# Patient Record
Sex: Male | Born: 1985 | Race: Black or African American | Hispanic: No | Marital: Single | Smoking: Current every day smoker
Health system: Southern US, Community
[De-identification: ages and names within clinical notes are randomized; demographics above are authoritative.]

## PROBLEM LIST (undated history)

## (undated) DIAGNOSIS — L709 Acne, unspecified: Secondary | ICD-10-CM

## (undated) DIAGNOSIS — F419 Anxiety disorder, unspecified: Secondary | ICD-10-CM

## (undated) DIAGNOSIS — F259 Schizoaffective disorder, unspecified: Secondary | ICD-10-CM

## (undated) DIAGNOSIS — F70 Mild intellectual disabilities: Secondary | ICD-10-CM

## (undated) DIAGNOSIS — L97509 Non-pressure chronic ulcer of other part of unspecified foot with unspecified severity: Secondary | ICD-10-CM

## (undated) DIAGNOSIS — F71 Moderate intellectual disabilities: Secondary | ICD-10-CM

## (undated) DIAGNOSIS — F639 Impulse disorder, unspecified: Secondary | ICD-10-CM

## (undated) HISTORY — DX: Schizoaffective disorder, unspecified: F25.9

## (undated) HISTORY — DX: Mild intellectual disabilities: F70

## (undated) HISTORY — PX: NO PAST SURGERIES: SHX2092

---
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
## 2006-09-11 ENCOUNTER — Encounter: Payer: Self-pay | Admitting: Family Medicine

## 2009-08-29 ENCOUNTER — Ambulatory Visit: Payer: Self-pay | Admitting: Family Medicine

## 2009-08-29 DIAGNOSIS — L708 Other acne: Secondary | ICD-10-CM | POA: Insufficient documentation

## 2009-08-29 DIAGNOSIS — F71 Moderate intellectual disabilities: Secondary | ICD-10-CM | POA: Insufficient documentation

## 2009-08-29 DIAGNOSIS — F639 Impulse disorder, unspecified: Secondary | ICD-10-CM

## 2009-08-29 DIAGNOSIS — F259 Schizoaffective disorder, unspecified: Secondary | ICD-10-CM | POA: Insufficient documentation

## 2009-08-29 DIAGNOSIS — F17200 Nicotine dependence, unspecified, uncomplicated: Secondary | ICD-10-CM | POA: Insufficient documentation

## 2009-08-29 HISTORY — DX: Impulse disorder, unspecified: F63.9

## 2009-08-29 HISTORY — DX: Moderate intellectual disabilities: F71

## 2009-09-06 ENCOUNTER — Ambulatory Visit: Payer: Self-pay | Admitting: Family Medicine

## 2009-09-06 ENCOUNTER — Encounter: Payer: Self-pay | Admitting: Family Medicine

## 2009-09-06 LAB — CONVERTED CEMR LAB
ALT: 38 units/L (ref 0–53)
AST: 30 units/L (ref 0–37)
Albumin: 4.1 g/dL (ref 3.5–5.2)
Alkaline Phosphatase: 106 units/L (ref 39–117)
BUN: 8 mg/dL (ref 6–23)
Calcium: 9.1 mg/dL (ref 8.4–10.5)
Chloride: 104 meq/L (ref 96–112)
Creatinine, Ser: 0.79 mg/dL (ref 0.40–1.50)
HDL: 35 mg/dL — ABNORMAL LOW (ref >39)
LDL Cholesterol: 147 mg/dL — ABNORMAL HIGH (ref 0–99)
Potassium: 4.1 meq/L (ref 3.5–5.3)
Total CHOL/HDL Ratio: 6

## 2010-02-18 ENCOUNTER — Ambulatory Visit: Payer: Self-pay | Admitting: Family Medicine

## 2010-08-06 NOTE — Consult Note (Signed)
Summary: [facility]  [facility]   Imported By: De Nurse 09/12/2009 14:32:32  _____________________________________________________________________  External Attachment:    Type:   Image     Comment:   External Document

## 2010-08-06 NOTE — Assessment & Plan Note (Signed)
Summary: cpe,df   Vital Signs:  Patient profile:   25 year old male Height:      63 inches Weight:      153 pounds BMI:     27.20 Temp:     98.1 degrees F oral Pulse rate:   79 / minute BP sitting:   127 / 84  (left arm) Cuff size:   regular  Vitals Entered By: Tessie Fass CMA (February 18, 2010 4:15 PM) CC: CPE Pain Assessment Patient in pain? no        Primary Care Provider:  Clementeen Graham  CC:  CPE.  History of Present Illness: Doing well in group home. Down to 7 ciggs a day. Feels well. Minimal outbursts and behavior is well controlled.  Medication reviewed with MAR by staff.  FL2 form filled out.   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 1 By Mouth Bid 3)  Benzoyl Peroxide 3 % Pads (Benzoyl Peroxide) 4)  Ziana 1.2-0.025 % Gel (Clindamycin-Tretinoin) 5)  Minocycline Hcl 100 Mg Tabs (Minocycline Hcl) .Marland Kitchen.. 1 By Mouth Bid  Allergies (verified): No Known Drug Allergies  Past History:  Past Medical History: 1) Acne, on Minocycline, Benzoyl Peroxide, and Ziana Gel,  Sees [facility] in [place]. 2) Psych Problems:  See problem list.  Currently managed by Haldol Depo and oral daily.  Well controlled.  Hears voices sometimes.  Has hx of admit to [facility] in past. Currently is ward of the state. 3) Tobacco. On 7 ciggs a day and stable. Was on 3 PPD  Family History: Reviewed history from 08/29/2009 and no changes required. Unknown,  Wilis is unable to provide  Social History: Reviewed history from 08/29/2009 and no changes required. Is a ward of the state.   Is living at [facility] in [place] since 03/2010. Contact person is April Evans [phone] Guardian is Kristin Bruins, she is through [place] [facility] [place].  [phone] Hx of Alcohol, MJ, and Tobacco abuse.  Currently smokes 7 cigs daily, but hx of 3 ppd.  Current Smoker Regular exercise-yes Packs/Day:  0.25  Review of Systems  The patient denies anorexia, weight loss, chest pain, prolonged cough, abdominal pain, hematuria, muscle weakness, difficulty walking, unusual weight change, and enlarged lymph nodes.    Physical Exam  General:  VS noted.  Well male in NAD Eyes:  No corneal or conjunctival inflammation noted. EOMI. Perrla. Vision grossly normal. Ears:  External ear exam shows no significant lesions or deformities.  Otoscopic examination reveals clear canals, tympanic membranes are intact bilaterally without bulging, retraction, inflammation or discharge. Hearing is grossly normal bilaterally. Nose:  External nasal examination shows no deformity or inflammation. Nasal mucosa are pink and moist without lesions or exudates. Mouth:  Oral mucosa and oropharynx without lesions or exudates.  Teeth in good repair. Lungs:  Normal respiratory effort, chest expands symmetrically. Lungs are clear to auscultation, no crackles or wheezes. Heart:  Normal rate and regular rhythm. S1 and  S2 normal without gallop, murmur, click, rub or other extra sounds. Abdomen:  Bowel sounds positive,abdomen soft and non-tender without masses, organomegaly or hernias noted. Msk:  No deformity or scoliosis noted of thoracic or lumbar spine.   Extremities:  No clubbing, cyanosis, edema, or deformity noted with normal full range of motion of all joints.   Neurologic:  No cranial nerve deficits noted. Station and gait are normal. Plantar reflexes are down-going bilaterally. DTRs  are symmetrical throughout. Sensory, motor and coordinative functions appear intact. Skin:  turgor normal, color normal, no rashes, no suspicious lesions, no ecchymoses, no petechiae, no purpura, no ulcerations, no edema.  One leaf tattoo noted on left forarm and one old scar on right forarm.   Cervical Nodes:  No lymphadenopathy noted Axillary Nodes:  No palpable lymphadenopathy Psych:  not anxious appearing, not depressed appearing, not agitated, not suicidal, not homicidal, and flat affect.     Impression & Recommendations:  Problem # 1:  PHYSICAL EXAMINATION (ICD-V70.0) Assessment Unchanged  Doing well in group home. Obviously well looked after. No current issues aside from tobacco use. Jowan is wedded to the idea of smoking 7 ciggs daily and is down from 3ppd.  Plan to encourage to use 6 ciggs a day and follow.  Plan to follow up in 1 year. Flu shot in fall.   Orders: [facility] - Est  18-39 yrs ([id number])  Complete Medication List: 1)  Haloperidol Depo 100 Mg Inj Q 4 Weeks  2)  Haloperidol 10 Mg Tabs (Haloperidol) .Marland Kitchen.. 1 by mouth bid 3)  Benzoyl Peroxide 3 % Pads (Benzoyl peroxide) 4)  Ziana 1.2-0.025 % Gel (Clindamycin-tretinoin) 5)  Minocycline Hcl 100 Mg Tabs (Minocycline hcl) .Marland Kitchen.. 1 by mouth bid  Patient Instructions: 1)  Please return for flu shot in the fall. 2)  Come back for a physical in 1 year or sooner if needed.
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
## 2011-02-28 ENCOUNTER — Encounter: Payer: Self-pay | Admitting: Family Medicine

## 2011-03-13 ENCOUNTER — Encounter: Payer: Self-pay | Admitting: Family Medicine

## 2011-03-13 ENCOUNTER — Ambulatory Visit (INDEPENDENT_AMBULATORY_CARE_PROVIDER_SITE_OTHER): Payer: Medicaid Other | Admitting: Family Medicine

## 2011-03-13 DIAGNOSIS — F71 Moderate intellectual disabilities: Secondary | ICD-10-CM

## 2011-03-13 DIAGNOSIS — F259 Schizoaffective disorder, unspecified: Secondary | ICD-10-CM

## 2011-03-13 DIAGNOSIS — F17200 Nicotine dependence, unspecified, uncomplicated: Secondary | ICD-10-CM

## 2011-03-13 NOTE — Assessment & Plan Note (Signed)
No desire to quit at this time.

## 2011-03-13 NOTE — Assessment & Plan Note (Signed)
Managed by psychiatry with haloperidol doing well no changes

## 2011-03-13 NOTE — Assessment & Plan Note (Signed)
Living in a group home is managing his own ADLs well

## 2011-03-13 NOTE — Progress Notes (Signed)
Thomas [name] is here for his annual physical exam.  He continues to live at the group home and he continues to be a ward of the state.  His medications were updated he currently takes depo haloperidol, and oral haloperidol.  Per the patient and his staff with him he is doing well and in not having very many destructive outbursts.  He continues to smoke 7 cigarettes daily and has no desire to quit.  Otherwise he's doing well.  PMH reviewed.  ROS as above otherwise neg  Exam:  BP 122/83  Pulse 86  Wt 153 lb (69.4 kg) Gen: Well NAD HEENT: EOMI,  MMM Lungs: CTABL Nl WOB Heart: RRR no MRG Abd: NABS, NT, ND Exts: Non edematous BL  LE, warm and well perfused.  Skin: On face scars and evidence of pustular acne, several tattoos on limbs, otherwise no rashes.

## 2011-10-07 ENCOUNTER — Encounter: Payer: Self-pay | Admitting: Family Medicine

## 2011-10-07 ENCOUNTER — Ambulatory Visit (INDEPENDENT_AMBULATORY_CARE_PROVIDER_SITE_OTHER): Payer: Medicaid Other | Admitting: Family Medicine

## 2011-10-07 DIAGNOSIS — K5900 Constipation, unspecified: Secondary | ICD-10-CM | POA: Insufficient documentation

## 2011-10-07 DIAGNOSIS — K921 Melena: Secondary | ICD-10-CM | POA: Insufficient documentation

## 2011-10-07 MED ORDER — POLYETHYLENE GLYCOL 3350 17 GM/SCOOP PO POWD: 17.0000 g | Freq: Two times a day (BID) | ORAL | Status: AC | PRN

## 2011-10-07 NOTE — Assessment & Plan Note (Signed)
Likely nidus for internal hemorrhoids w/ strenuous BMs and secondary bloody stools x 2. Will obtain KUB to assess stool burden. Scheduled miralax. Handout given. F/u prn.

## 2011-10-07 NOTE — Patient Instructions (Signed)

## 2011-10-07 NOTE — Progress Notes (Signed)
  Subjective:    Patient ID: Thomas [name], male    DOB: Oct 27, 1985, 26 y.o.   MRN: [id number]  HPI Pt is here to discuss bloody stools and constipation Pt is currently ;iving in an adult grouop home 2/2 moderate MR and schizoaffective disorder. Caregiver is present with pt today.   Blood in stools x 2 this week. Pt states that he noticed a small amount of blood in toilet after stooling. Last episode was yesterday. First episode was late last week. Pt with a baseline hx/o chronic constipation. Pt states that he has had progressively strenuous BMs over the last 2-3 weeks. Pt denies any ASA, NSAID use. No noted rectal trauma per pt. Pt denies any abdominal pain, nausea, dysuria, hematuria.    Review of Systems See HPI, otherwise ROS negative     Objective:   Physical Exam  Genitourinary:          Noted internal hemmorrhoids on anoscopy  Hemoccult negative    Gen: up in chair, NAD CV: RRR, no murmurs auscultated PULM: CTAB, no wheezes, rales, rhoncii ABD: S/NT/+ bowel sounds  EXT: 2+ peripheral pulses         Assessment & Plan:

## 2011-10-07 NOTE — Assessment & Plan Note (Signed)
Likely secondary to strenuous BMs + internal hemorrhoids. Overall exam reassuring. Hemoccult negative. Scheduled miralax.

## 2011-10-08 ENCOUNTER — Encounter: Payer: Self-pay | Admitting: Family Medicine

## 2011-10-08 ENCOUNTER — Ambulatory Visit
Admission: RE | Admit: 2011-10-08 | Discharge: 2011-10-08 | Disposition: A | Discharge status: Home / Self Care | Payer: Medicaid Other | Source: Ambulatory Visit | Attending: Family Medicine | Admitting: Family Medicine

## 2011-12-19 ENCOUNTER — Encounter: Payer: Self-pay | Admitting: Family Medicine

## 2011-12-19 ENCOUNTER — Ambulatory Visit (INDEPENDENT_AMBULATORY_CARE_PROVIDER_SITE_OTHER): Payer: Medicaid Other | Admitting: Family Medicine

## 2011-12-19 VITALS — BP 121/84 | HR 71 | Ht 63.0 in | Wt 157.0 lb

## 2011-12-19 DIAGNOSIS — L708 Other acne: Secondary | ICD-10-CM

## 2011-12-19 MED ORDER — BENZOYL PEROXIDE-CLEANSER 4 % EX KIT: 1.0000 "application " | PACK | Freq: Two times a day (BID) | CUTANEOUS | Status: DC

## 2011-12-19 MED ORDER — TRETINOIN 0.025 % EX CREA: TOPICAL_CREAM | Freq: Every day | CUTANEOUS | Status: AC

## 2011-12-19 MED ORDER — CLINDAMYCIN PHOS-BENZOYL PEROX 1-5 % EX GEL: Freq: Every day | CUTANEOUS | Status: AC

## 2011-12-19 MED ORDER — DOXYCYCLINE HYCLATE 100 MG PO TABS: 100.0000 mg | ORAL_TABLET | Freq: Two times a day (BID) | ORAL | Status: AC

## 2011-12-19 NOTE — Assessment & Plan Note (Signed)
Pustular acne. Plan to resume topical therapies as noted in the medication list. Additionally we'll add doxycycline twice a day. Additionally I will refer to dermatology for further evaluation and management. He may not be fully controlled with the above medications. He may benefit from Accutane however it concern about worsening behavioral control at this medication. We'll follow this issue in a month or 2 or with dermatology.  Additionally warned about sunscreen with doxycycline.

## 2011-12-19 NOTE — Progress Notes (Signed)
Thomas [name] is a 26 y.o. male who presents to [facility] today for pustular acne. Patient has a history of acne and previously was taking tretinoin, BenzaClin, and a face wash.  This is somewhat controlled his acne. However he became frustrated and stopped taking the topical medications and his acne has worsened.  He wonders if there is a pill he can take for acne control.   He is otherwise well. His moderate mental retardation and schizoaffective disorder are well-controlled at the group home with haloperidol.  He continues to smoke daily.   PMH: Reviewed schizoaffective disorder and mild mental retardation History  Substance Use Topics  . Smoking status: Current Everyday Smoker -- 0.5 packs/day  . Smokeless tobacco: Never Used  . Alcohol Use: No   ROS as above  Medications reviewed. Current Outpatient Prescriptions  Medication Sig Dispense Refill  . haloperidol (HALDOL) 5 MG tablet Take 5 mg by mouth 2 (two) times daily.        . haloperidol decanoate (HALDOL DECANOATE) 100 MG/ML injection Inject 100 mg into the muscle every 28 (twenty-eight) days.        Marland Kitchen Benzoyl Peroxide-Cleanser (LAVOCLEN-4 ACNE WASH) 4 % KIT Apply 1 application topically 2 (two) times daily. 1 qs  1 each  5  . clindamycin-benzoyl peroxide (BENZACLIN) gel Apply topically daily.  25 g  5  . doxycycline (VIBRA-TABS) 100 MG tablet Take 1 tablet (100 mg total) by mouth 2 (two) times daily.  60 tablet  2  . tretinoin (RETIN-A) 0.025 % cream Apply topically at bedtime.  45 g  5    Exam:  BP 121/84  Pulse 71  Ht 5\' 3"  (1.6 m)  Wt 157 lb (71.215 kg)  BMI 27.81 kg/m2 Gen: Well NAD SKIN: Pustular acne on the face with scarring on both cheeks   No results found for this or any previous visit (from the past 72 hour(s)).

## 2011-12-19 NOTE — Patient Instructions (Addendum)
Thank you for coming in today. For acne use the crams and gells as directed.  Take the doxycycline twice a day.  We will call within 1 week about the appointment to the dermatologist.  Use sun screen with doxycycline.  If you have stomach upset let us know.  Otherwise we should check in at least 1x a year.  Consider stopping smoking.

## 2012-03-19 ENCOUNTER — Ambulatory Visit (INDEPENDENT_AMBULATORY_CARE_PROVIDER_SITE_OTHER): Payer: Medicaid Other | Admitting: Family Medicine

## 2012-03-19 ENCOUNTER — Encounter: Payer: Self-pay | Admitting: Family Medicine

## 2012-03-19 VITALS — BP 126/89 | HR 84 | Ht 64.0 in | Wt 156.0 lb

## 2012-03-19 DIAGNOSIS — Z5181 Encounter for therapeutic drug level monitoring: Secondary | ICD-10-CM

## 2012-03-19 LAB — CBC
HCT: 43.8 % (ref 39.0–52.0)
Platelets: 234 10*3/uL (ref 150–400)
RBC: 5.03 MIL/uL (ref 4.22–5.81)
RDW: 13.9 % (ref 11.5–15.5)
WBC: 13 10*3/uL — ABNORMAL HIGH (ref 4.0–10.5)

## 2012-03-19 LAB — COMPREHENSIVE METABOLIC PANEL
ALT: 25 U/L (ref 0–53)
CO2: 28 mEq/L (ref 19–32)
Calcium: 9.5 mg/dL (ref 8.4–10.5)
Chloride: 106 mEq/L (ref 96–112)
Creat: 0.89 mg/dL (ref 0.50–1.35)
Glucose, Bld: 67 mg/dL — ABNORMAL LOW (ref 70–99)
Total Bilirubin: 0.8 mg/dL (ref 0.3–1.2)
Total Protein: 7 g/dL (ref 6.0–8.3)

## 2012-03-19 LAB — LIPID PANEL
HDL: 32 mg/dL — ABNORMAL LOW (ref >39)
Total CHOL/HDL Ratio: 6.4 Ratio
Triglycerides: 134 mg/dL (ref <150)

## 2012-03-21 NOTE — Progress Notes (Signed)
Patient ID: Thomas [name], male   DOB: 06-26-1986, 26 y.o.   MRN: [id number] SUBJECTIVE:  Thomas [name] is a 26 y.o. male presenting for his annual checkup.  He is currently in a group home.  He is followed by psych for his schizoaffective disorder.  His medication regimen has been changed to haldol tid and his haldol decanoate has been discontinued.  He has also been started on doxycycline daily for pustular acne.   Current Outpatient Prescriptions  Medication Sig Dispense Refill  . doxycycline (DORYX) 100 MG DR capsule Take 100 mg by mouth daily.      Marland Kitchen Benzoyl Peroxide-Cleanser (LAVOCLEN-4 ACNE WASH) 4 % KIT Apply 1 application topically 2 (two) times daily. 1 qs  1 each  5  . clindamycin-benzoyl peroxide (BENZACLIN) gel Apply topically daily.  25 g  5  . haloperidol (HALDOL) 5 MG tablet Take 5 mg by mouth 3 (three) times daily.       Marland Kitchen tretinoin (RETIN-A) 0.025 % cream Apply topically at bedtime.  45 g  5   Allergies: Review of patient's allergies indicates no known allergies.   ROS:  Feeling well. No dyspnea or chest pain on exertion. No abdominal pain, change in bowel habits, black or bloody stools. No urinary tract or prostatic symptoms. No neurological complaints.  OBJECTIVE:  The patient appears well, alert, oriented x 3, in no distress.  BP 126/89  Pulse 84  Ht 5\' 4"  (1.626 m)  Wt 156 lb (70.761 kg)  BMI 26.78 kg/m2 ENT normal.  Neck supple. No adenopathy or thyromegaly. PERLA. Lungs are clear, good air entry, no wheezes, rhonchi or rales. S1 and S2 normal, no murmurs, regular rate and rhythm. Abdomen is soft without tenderness, guarding, mass or organomegaly.  Extremities with multiple tattoos. No edema, normal peripheral pulses. Neurological is normal without focal findings.  ASSESSMENT:  healthy adult male  PLAN:  quit smoking and return for routine annual checkups Check labwork given daily antipsychotic use.

## 2012-06-17 ENCOUNTER — Other Ambulatory Visit: Payer: Self-pay | Admitting: Family Medicine

## 2012-06-17 NOTE — Telephone Encounter (Signed)
Pt is out of his doxycycline and needs refill for his acne  [facility] - [phone] - [place]

## 2012-06-17 NOTE — Telephone Encounter (Signed)
Fwd. To PCP for refill. .Thomas [name]  

## 2012-06-21 MED ORDER — DOXYCYCLINE HYCLATE 100 MG PO CPEP: 100.0000 mg | ORAL_CAPSULE | Freq: Every day | ORAL | Status: DC

## 2013-03-31 ENCOUNTER — Ambulatory Visit (INDEPENDENT_AMBULATORY_CARE_PROVIDER_SITE_OTHER): Payer: Medicaid Other | Admitting: Family Medicine

## 2013-03-31 VITALS — BP 107/78 | HR 85 | Ht 64.0 in | Wt 147.0 lb

## 2013-03-31 DIAGNOSIS — F259 Schizoaffective disorder, unspecified: Secondary | ICD-10-CM

## 2013-03-31 DIAGNOSIS — L708 Other acne: Secondary | ICD-10-CM

## 2013-03-31 DIAGNOSIS — Z23 Encounter for immunization: Secondary | ICD-10-CM

## 2013-03-31 DIAGNOSIS — F17200 Nicotine dependence, unspecified, uncomplicated: Secondary | ICD-10-CM

## 2013-03-31 LAB — COMPREHENSIVE METABOLIC PANEL
Alkaline Phosphatase: 97 U/L (ref 39–117)
CO2: 26 mEq/L (ref 19–32)
Creat: 0.79 mg/dL (ref 0.50–1.35)
Glucose, Bld: 83 mg/dL (ref 70–99)
Total Bilirubin: 0.6 mg/dL (ref 0.3–1.2)

## 2013-03-31 LAB — CBC WITH DIFFERENTIAL/PLATELET
Eosinophils Relative: 1 % (ref 0–5)
HCT: 44.2 % (ref 39.0–52.0)
Hemoglobin: 15.3 g/dL (ref 13.0–17.0)
Lymphocytes Relative: 28 % (ref 12–46)
Lymphs Abs: 2.4 10*3/uL (ref 0.7–4.0)
MCV: 85.7 fL (ref 78.0–100.0)
Monocytes Absolute: 0.5 10*3/uL (ref 0.1–1.0)
Monocytes Relative: 5 % (ref 3–12)
RBC: 5.16 MIL/uL (ref 4.22–5.81)
WBC: 8.5 10*3/uL (ref 4.0–10.5)

## 2013-03-31 NOTE — Assessment & Plan Note (Signed)
Managed by Psychiatrist on Haldol, no changes Checked CMP and CBC

## 2013-03-31 NOTE — Progress Notes (Signed)
  Subjective:    Patient ID: Thomas [name], male    DOB: February 26, 1986, 27 y.o.   MRN: [id number]  HPI  Pt here for annual exam , He has schizzoeaffective disorder and lives ina group home.   He denies any problems today, denies fever, chills, sweats, sore throat, dyspnea, chest pain, abd pain, nausea, constipation, diarrhea, and swelling.   I filled out his FL2 for this year  He see's a psychiatrist , Ellis Savage He see's a dermatologist for acne, at [facility].   Review of Systems Per HPI    Objective:   Physical Exam  Gen: NAD, alert, cooperative with exam HEENT: NCAT, EOMI, PERRL CV: RRR, good S1/S2, no murmur Resp: CTABL, no wheezes, non-labored Abd: SNTND, no guarding or organomegaly Ext: No edema, warm Neuro: Alert and oriented, No gross deficits, Strength 5/5 and sensation intact in all 4 extremities, CN2-12 intact except for slight tongue deviation to the right with protrusion.      Assessment & Plan:

## 2013-03-31 NOTE — Assessment & Plan Note (Signed)
Currently smoking, likely part of his disease state with schizoaffective d/o

## 2013-03-31 NOTE — Patient Instructions (Signed)
It was great to meet you   Please let us know if he gets sick at all, and we'd be glad to see him. Otherwise we'll see you in a year.   I will let you know if anything is off with his labs, otherwise I will send a letter if they are normal.

## 2013-03-31 NOTE — Assessment & Plan Note (Signed)
Pustular acne, now on topical retinoid and PO doxy per derm.

## 2013-04-01 ENCOUNTER — Encounter: Payer: Self-pay | Admitting: Family Medicine

## 2014-02-27 ENCOUNTER — Telehealth: Payer: Self-pay | Admitting: Family Medicine

## 2014-02-27 DIAGNOSIS — L708 Other acne: Secondary | ICD-10-CM

## 2014-02-27 NOTE — Telephone Encounter (Signed)
Pt request a new referral to [facility]. He is an established patient there but referral has expired and her needs a new one before they will schedule him. Pls advise.

## 2014-02-27 NOTE — Telephone Encounter (Signed)
Forward to PCP to place new referral.Thomas [name], Rodena Medin

## 2014-02-27 NOTE — Telephone Encounter (Signed)
New referral for [facility] derm written.   Murtis Sink, MD [facility] Resident, PGY-3 02/27/2014, 4:43 PM

## 2014-03-27 ENCOUNTER — Encounter: Payer: Self-pay | Admitting: Family Medicine

## 2014-03-27 ENCOUNTER — Ambulatory Visit (INDEPENDENT_AMBULATORY_CARE_PROVIDER_SITE_OTHER): Payer: Medicaid Other | Admitting: Family Medicine

## 2014-03-27 VITALS — BP 130/90 | HR 100 | Temp 98.2°F | Ht 64.0 in | Wt 142.9 lb

## 2014-03-27 DIAGNOSIS — L708 Other acne: Secondary | ICD-10-CM | POA: Diagnosis not present

## 2014-03-27 DIAGNOSIS — F259 Schizoaffective disorder, unspecified: Secondary | ICD-10-CM

## 2014-03-27 DIAGNOSIS — B353 Tinea pedis: Secondary | ICD-10-CM

## 2014-03-27 DIAGNOSIS — F71 Moderate intellectual disabilities: Secondary | ICD-10-CM | POA: Diagnosis not present

## 2014-03-27 DIAGNOSIS — Z23 Encounter for immunization: Secondary | ICD-10-CM

## 2014-03-27 DIAGNOSIS — M21619 Bunion of unspecified foot: Secondary | ICD-10-CM

## 2014-03-27 DIAGNOSIS — F17200 Nicotine dependence, unspecified, uncomplicated: Secondary | ICD-10-CM | POA: Diagnosis not present

## 2014-03-27 DIAGNOSIS — M21612 Bunion of left foot: Secondary | ICD-10-CM

## 2014-03-27 MED ORDER — NYSTATIN 100000 UNIT/GM EX POWD: Freq: Two times a day (BID) | CUTANEOUS | Status: DC

## 2014-03-27 NOTE — Assessment & Plan Note (Signed)
Not ready to quit Schizoaffective d/o

## 2014-03-27 NOTE — Assessment & Plan Note (Signed)
On topical tretinoin and minocycline per derm.

## 2014-03-27 NOTE — Assessment & Plan Note (Signed)
Filled out FL2, lives in a group home considering mental illness as well.

## 2014-03-27 NOTE — Assessment & Plan Note (Signed)
L 5th toe bunion Requests podiatry Referral written

## 2014-03-27 NOTE — Progress Notes (Signed)
Patient ID: Thomas [name], male   DOB: 12/02/1985, 28 y.o.   MRN: [id number]   HPI  Patient presents today for annual check up, itchy feet  Itchy feet- has been going on for a while, some pain after vigorous scratching, both feet Hasnt tried anything for this  Bunion On his l 5th toe, they request podiatry Not terribly painful Has not tried any salicylic acid/pads  Smoking- not interested in quitting.   Smoking status noted ROS: Per HPI  Objective: BP 130/90  Pulse 100  Temp(Src) 98.2 F (36.8 C) (Oral)  Ht  (1.626 m)  Wt 142 lb 14.4 oz (64.819 kg)  BMI 24.52 kg/m2 Gen: NAD, alert, cooperative with exam HEENT: NCAT, EOMI, OP WNL CV: RRR, good S1/S2, no murmur Resp: CTABL, no wheezes, non-labored Abd: SNTND, BS present Ext: No edema, warm Neuro: Alert and oriented, No gross deficits Feet: maceration between 4th and 5th digit BL, Few red papules on lateral distal feet, no excoriation and no open lesions  Assessment and plan:  Tinea pedis BL between 4th and 5th digit Macerated look so will use powder for drying, nystatin powder Followup PRN  SCHIZOAFFECTIVE DISORDER UNSPECIFIED On Hladol PO daily, 5 mg Managed by psychiatry They will fax recent labs   NICOTINE ADDICTION Not ready to quit Schizoaffective d/o  MENTAL RETARDATION, MODERATE Filled out FL2, lives in a group home considering mental illness as well.   ACNE VULGARIS, CYSTIC, PUSTULAR On topical tretinoin and minocycline per derm.   Bunion L 5th toe bunion Requests podiatry Referral written      Meds ordered this encounter  Medications  . nystatin (MYCOSTATIN) powder    Sig: Apply topically 2 (two) times daily. To feet    Dispense:  15 g    Refill:  0

## 2014-03-27 NOTE — Patient Instructions (Signed)
Great to see you today  Let me know if his feet dont get better  Come back next year or sooner if needed.

## 2014-03-27 NOTE — Assessment & Plan Note (Signed)
On Hladol PO daily, 5 mg Managed by psychiatry They will fax recent labs

## 2014-03-27 NOTE — Assessment & Plan Note (Signed)
BL between 4th and 5th digit Macerated look so will use powder for drying, nystatin powder Followup PRN

## 2014-04-05 ENCOUNTER — Ambulatory Visit (INDEPENDENT_AMBULATORY_CARE_PROVIDER_SITE_OTHER): Payer: Medicaid Other | Admitting: Podiatry

## 2014-04-05 ENCOUNTER — Encounter: Payer: Self-pay | Admitting: Podiatry

## 2014-04-05 VITALS — BP 112/78 | HR 77 | Resp 14 | Ht 64.0 in | Wt 147.0 lb

## 2014-04-05 DIAGNOSIS — L97509 Non-pressure chronic ulcer of other part of unspecified foot with unspecified severity: Secondary | ICD-10-CM

## 2014-04-05 DIAGNOSIS — L988 Other specified disorders of the skin and subcutaneous tissue: Secondary | ICD-10-CM

## 2014-04-05 NOTE — Patient Instructions (Signed)
Apply Betadine in between the fourth and fifth toes on both feet. Cover with a bandage. Monitor the wound on the right fifth toe. Monitor for any signs/symptoms of infection. Call the office immediately if any occur or go directly to the emergency room. Call with any questions/concerns.

## 2014-04-05 NOTE — Progress Notes (Signed)
   Subjective:    Patient ID: Thomas [name], male    DOB: Dec 09, 1985, 28 y.o.   MRN: [id number]  HPI Comments: Pt states he has had these areas B/L 4th interdigital space for over 6 months, and complains of itching.   Patient was seen by his primary care physician who recommended nystatin powder due to the maceration in the interspaces. Denies any systemic complaints as fevers, chills, nausea, vomiting. No other complaints at this time.   Review of Systems  All other systems reviewed and are negative.      Objective:   Physical Exam AAO x3, NAD DP PT pulses palpable 2/4 bilaterally, CRT less than 3 seconds Protective sensation intact with Simms Weinstein monofilament, vibratory sensation intact, Achilles tendon reflex intact Significant maceration of the fourth interspaces bilaterally. Adductovarus of the fifth digits. On the right medial fifth digit there is a superficial granular ulceration measuring approximately 3 x 3 mm without any surrounding erythema, drainage, a sitting cellulitis, fluctuance, crepitus. No tenderness to palpation of the digit or along the metatarsals. No pain with range of motion of the MPJs. MMT 5/5, ROM WNL. No calf pain, swelling, warmth.       Assessment & Plan:  28 year old male with significant bilateral fourth interspace maceration and superficial ulceration medial aspect right fifth digit. Underlying adductovarus deformity of the fifth digit -Conservative versus surgical treatment were discussed including alternatives, risks, complications. -Digit significant maceration recommend a drying interspaces with Betadine. Can also use nystatin powder. -Closely monitor the wound on the right foot. Monitor for any changes or any signs or symptoms of infection. -Followup in 2 weeks or sooner if any problems are to arise or any changes symptoms. In the meantime call any questions, concerns.

## 2014-04-06 ENCOUNTER — Encounter: Payer: Self-pay | Admitting: Podiatry

## 2014-04-21 ENCOUNTER — Encounter: Payer: Self-pay | Admitting: Podiatry

## 2014-04-21 ENCOUNTER — Ambulatory Visit (INDEPENDENT_AMBULATORY_CARE_PROVIDER_SITE_OTHER): Payer: Medicaid Other | Admitting: Podiatry

## 2014-04-21 VITALS — BP 107/78 | HR 77 | Resp 16

## 2014-04-21 DIAGNOSIS — L97511 Non-pressure chronic ulcer of other part of right foot limited to breakdown of skin: Secondary | ICD-10-CM

## 2014-04-21 DIAGNOSIS — L988 Other specified disorders of the skin and subcutaneous tissue: Secondary | ICD-10-CM

## 2014-04-21 NOTE — Progress Notes (Signed)
Patient ID: Thomas CrockerWillis [name], male   DOB: 1986-01-20, 28 y.o.   MRN: [id number]  Subjective: Thomas [name] returns the office with a caregiver for followup evaluation of bilateral fourth digit interdigital maceration and superficial ulceration of the medial aspect of the fifth digit on the right side. His last appointment he states he been applying Betadine into the toes. Also states that he has been drying between his toes daily. Denies any systemic signs of infection including any fevers, chills, nausea, vomiting. Denies any redness or drainage. No other complaints at this time.  Objective: AAO x3, NAD DP/PT pulses palpable bilaterally, CRT less than 3 seconds Protective sensation intact with Simms Weinstein monofilament. Significant maceration in the fourth interspaces bilaterally. Superficial granular ulceration on the medial aspect of the fifth digit measuring approximately 3 x 3 mm without any surrounding erythema, drainage, ascending cellulitis, fluctuance, crepitus. No tenderness to palpation. No purulence identified or drainage. Small pre-ulcerative lesion on the medial aspect of the left fifth digit. No surrounding erythema, drainage, fluctuance, crepitus. No other lesions identified. No calf pain, swelling, warmth.  Assessment: Thomas [name] presents with bilateral fourth interdigital maceration and superficial ulceration medial fifth digit pre-ulcerative lesion medial left fifth digit.  Plan: -Conservative and surgical treatment discussed including alternatives, risks, complications. -Recommend soaking in Betadine soaks daily. Apply castellani's paint bid. Can continue nystatin powder.  Monitoring signs or symptoms of infection and directed to call the office immediately if any are to occur or go directly to the emergency room. -Followup in 2 weeks. Meantime call the office and questions, concerns, change in symptoms.

## 2014-04-21 NOTE — Patient Instructions (Signed)
Betadine Soak Instructions  Purchase an 8 oz. bottle of BETADINE solution (Povidone)  THE DAY AFTER THE PROCEDURE  Place 1 tablespoon of betadine solution in a quart of warm tap water.  Submerge your foot or feet with outer bandage intact for the initial soak; this will allow the bandage to become moist and wet for easy lift off.  Once you remove your bandage, continue to soak in the solution for 20 minutes.  This soak should be done twice a day.  Next, remove your foot or feet from solution, blot dry the affected area and cover.  You may use a band aid large enough to cover the area or use gauze and tape.  Apply other medications to the area as directed by the doctor such as cortisporin otic solution (ear drops) or neosporin.  IF YOUR SKIN BECOMES IRRITATED WHILE USING THESE INSTRUCTIONS, IT IS OKAY TO SWITCH TO EPSOM SALTS AND WATER OR WHITE VINEGAR AND WATER.  Monitor for any signs/symptoms of infection. Call the office immediately if any occur or go directly to the emergency room. Call with any questions/concerns.   Apply castellani's paint between toes twice a day.

## 2014-05-05 ENCOUNTER — Encounter: Payer: Self-pay | Admitting: Podiatry

## 2014-05-05 ENCOUNTER — Ambulatory Visit (INDEPENDENT_AMBULATORY_CARE_PROVIDER_SITE_OTHER): Payer: Medicaid Other | Admitting: Podiatry

## 2014-05-05 VITALS — BP 110/65 | HR 78 | Resp 16

## 2014-05-05 DIAGNOSIS — L988 Other specified disorders of the skin and subcutaneous tissue: Secondary | ICD-10-CM

## 2014-05-05 DIAGNOSIS — L97511 Non-pressure chronic ulcer of other part of right foot limited to breakdown of skin: Secondary | ICD-10-CM

## 2014-05-05 NOTE — Patient Instructions (Addendum)
Wash feet over the next few days to wash off some of the medication. Restart applying castellani's paint on Monday. Continue to monitor the areas for any changes.  Monitor for any signs/symptoms of infection. Call the office immediately if any occur or go directly to the emergency room. Call with any questions/concerns. Follow up in 4 weeks, or sooner if needed. Call sooner if any questions/concerns/change in symptoms.

## 2014-05-08 ENCOUNTER — Encounter: Payer: Self-pay | Admitting: Podiatry

## 2014-05-08 NOTE — Progress Notes (Signed)
Patient ID: Thomas [name], male   DOB: March 27, 1986, 28 y.o.   MRN: [id number]  Subjective: Thomas [name], 28 year old male returns to the office today with his caregiver. His last appointment and applying Castellani's pain between the fourth and fifth digits bilaterally. They do state that the area is better compared to last appointment. Denies any redness or drainage from around the sites. Denies any purulence. Denies any systemic complaints as fevers, chills, nausea, vomiting. No acute changes with loss appointment. No other complaints at this time.  Objective: AAO x3, NAD DP/PT pulses palpable bilaterally, CRT less than 3 seconds Protective sensation intact with Simms Weinstein monofilament Bilateral fourth interspace with maceration although appears to be decreased compared to last appointment. Ulceration on the medial aspect of the fifth digit on the right foot mostly healed at this time. Small superficial ulceration on the medial aspect the fifth digit on the left foot. There is no surrounding erythema or drainage bilaterally. No ascending cellulitis. No purulence bilaterally. The area was washed of remaining cast pain interdigitally.  No further lesions identified. Pain, swelling, warmth, erythema. MMT 5/5, ROM WNL  Assessment: 28 year old male with bilateral fourth digit interdigital maceration, superficial ulcerations  Plan: -Treatment options discussed including alternatives, risks, complications. -Discussed to hold off on any further cast moist pain and wash the area thoroughly and dried over the next couple of days and then conserve reapplying the Castellani's paint daily if maceration continues. Continue to monitor for healing of the wounds.monitor for any clinical signs or symptoms of infection interrupted to call the office immediately if any are to occur ago directly to the emergency room. -Follow-Up in 1 month or sooner if any problems are to arise or any change in symptoms. In the  meantime call the office with any questions, concerns. Follow up sooner if the wounds do not heal over the next 2 weeks or if there is any worsening.

## 2014-06-09 ENCOUNTER — Encounter: Payer: Self-pay | Admitting: Podiatry

## 2014-06-09 ENCOUNTER — Ambulatory Visit (INDEPENDENT_AMBULATORY_CARE_PROVIDER_SITE_OTHER): Payer: Medicaid Other

## 2014-06-09 ENCOUNTER — Ambulatory Visit (INDEPENDENT_AMBULATORY_CARE_PROVIDER_SITE_OTHER): Payer: Medicaid Other | Admitting: Podiatry

## 2014-06-09 VITALS — BP 141/96 | HR 90 | Resp 13

## 2014-06-09 DIAGNOSIS — M79674 Pain in right toe(s): Secondary | ICD-10-CM

## 2014-06-09 DIAGNOSIS — L97509 Non-pressure chronic ulcer of other part of unspecified foot with unspecified severity: Secondary | ICD-10-CM

## 2014-06-09 DIAGNOSIS — L988 Other specified disorders of the skin and subcutaneous tissue: Secondary | ICD-10-CM

## 2014-06-09 DIAGNOSIS — L97511 Non-pressure chronic ulcer of other part of right foot limited to breakdown of skin: Secondary | ICD-10-CM

## 2014-06-09 MED ORDER — "ALLEVYN AG GENTLE 2""X2"" EX PADS": MEDICATED_PAD | CUTANEOUS | Status: DC

## 2014-06-09 MED ORDER — CEPHALEXIN 500 MG PO CAPS: 500.0000 mg | ORAL_CAPSULE | Freq: Three times a day (TID) | ORAL | Status: DC

## 2014-06-09 NOTE — Patient Instructions (Signed)
Monitor for any signs/symptoms of infection. Call the office immediately if any occur or go directly to the emergency room. Call with any questions/concerns.  

## 2014-06-12 ENCOUNTER — Telehealth: Payer: Self-pay | Admitting: *Deleted

## 2014-06-12 ENCOUNTER — Encounter: Payer: Self-pay | Admitting: Podiatry

## 2014-06-12 NOTE — Telephone Encounter (Signed)
He can use a small amount of betadine on some gauze and put them between his toes to help dry up the areas. Once the area is dry and no longer wet/white he can switch to a dry dressing. If he has any increase in swelling, pain, redness, or other signs of infection, he needs to call the office. Thanks.

## 2014-06-12 NOTE — Telephone Encounter (Signed)
"  We are calling to see if he can prescribe something else than the Silver Allevyn pads is not covered by his insurance.  He lives in a group home and will not be able to afford this.  Is there anything else he can prescribe or is there something over the counter he can use?"  I told her I would have to check with Dr. Ardelle AntonWagoner and see what he suggests and give you a call back.

## 2014-06-12 NOTE — Telephone Encounter (Signed)
I called and spoke to FelicityMichelle. I informed her that Dr. Ardelle AntonWagoner said he can just use a small amount of betadine on some gauze and put in between his toes to help dry out the areas once a day.  Once the area is dry or no longer wet/white he can switch to a dry dressing.  "Okay, thank you."  I called and left a message of Dr. Gabriel RungWagoner's instructions to the group home. Prescription was not covered by insurance, directions were changed.  Advised them to look out for signs of infection, if notice any symptoms please call the office.  I also informed them that this information was given to the pharmacist with instructions.

## 2014-06-12 NOTE — Progress Notes (Signed)
Patient ID: Thomas CrockerWillis [name], male   DOB: 09/12/1985, 28 y.o.   MRN: [id number]  Subjective: 28 year old male returns the office they for follow-up evaluation of bilateral fourth interspace maceration and superficial wound on the right fourth interspace. Patient states he is been continuing with castellani's paint, although he states he continues to have some areas in between the toes. Also states that he has some pain to the right fifth toe which is new. Denies any recent drainage, erythema to the digit. Denies any systemic complaints as fevers, chills nausea, vomiting. No other complaints at this time.  Objective: AAO x3, NAD DP/PT pulses palpable bilaterally, CRT less than 3 seconds Protective sensation intact with Simms Weinstein monofilament, vibratory sensation intact, Achilles tendon reflex intact Bilateral fourth interspace maceration continued although it does appear to be decreased compared to prior evaluation. There is a superficial wound in the fourth interspace underlying macerated tissue. Wound appears to be macerated with slight granulation tissue. There is no probing, undermining, tunneling. There is no swelling erythema, edema, increase in warmth.  There is mild tenderness directly over the fifth MTPJ on the right foot. There is no pain with range of motion of the joint. There is no pinpoint bony tenderness or pain with vibratory sensation along the digit with metatarsal. No calf pain with compression, swelling, warmth, erythema. MMT 5/5, ROM WNL  Assessment: 28 year old male with continued bilateral fourth interspace maceration and superficial wound right fourth interspace  Plan: -X-rays were obtained and reviewed with the patient/caregiver of the right foot -Treatment options were discussed including alternatives, risks, complications. -Macerated tissue sharply debrided complications to bilateral feet. -At this time we'll switch to formadon, and the patient was given a bottle of it  today and directed on use. Also, due to the new onset of pain, will prescribe keflex in case of infection. Discussed with the patient and caregiver that if any signs or symptoms of infection are to occur, to call the office or go to the ED.  -Prescribed silver allevyn pads to apply interdigitally.  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  -Follow-up in 2 weeks or sooner if any problems are to arise. In the meantime, call the office with any questions, concerns, change in symptoms.

## 2014-06-13 ENCOUNTER — Telehealth: Payer: Self-pay | Admitting: *Deleted

## 2014-06-13 NOTE — Telephone Encounter (Signed)
He can continue with the formadon which was given to him at the appointment or he can apply a small amount of betadine to some gauze and put it between the toes. Once the wetness/macerated tissue resolves, he can just apply a dry dressing.

## 2014-06-13 NOTE — Telephone Encounter (Addendum)
Marcelino DusterMichelle states the Allevyn is not covered by [employer] insurance, the closest substitute is silvadene iodine which is expensive.  Pleases advise.  Dr. Gabriel RungWagoner's orders of 06/13/2014 were called to pt's home supervisor, and I encouraged return call if there were any concerns.

## 2014-06-23 ENCOUNTER — Ambulatory Visit: Payer: Medicaid Other | Admitting: Podiatry

## 2014-08-17 ENCOUNTER — Encounter (HOSPITAL_COMMUNITY): Payer: Self-pay | Admitting: General Practice

## 2014-08-17 ENCOUNTER — Inpatient Hospital Stay (HOSPITAL_COMMUNITY)
Admission: AD | Admit: 2014-08-17 | Discharge: 2014-08-17 | DRG: 540 | Discharge status: Against Medical Advice | Payer: Medicaid Other | Source: Ambulatory Visit | Attending: Family Medicine | Admitting: Family Medicine

## 2014-08-17 ENCOUNTER — Telehealth: Payer: Self-pay | Admitting: *Deleted

## 2014-08-17 ENCOUNTER — Ambulatory Visit (INDEPENDENT_AMBULATORY_CARE_PROVIDER_SITE_OTHER): Payer: Medicaid Other | Admitting: Family Medicine

## 2014-08-17 ENCOUNTER — Encounter: Payer: Self-pay | Admitting: Family Medicine

## 2014-08-17 VITALS — BP 135/86 | HR 95 | Temp 98.5°F | Resp 16 | Wt 146.0 lb

## 2014-08-17 DIAGNOSIS — F639 Impulse disorder, unspecified: Secondary | ICD-10-CM | POA: Diagnosis present

## 2014-08-17 DIAGNOSIS — F71 Moderate intellectual disabilities: Secondary | ICD-10-CM | POA: Diagnosis present

## 2014-08-17 DIAGNOSIS — M869 Osteomyelitis, unspecified: Principal | ICD-10-CM | POA: Diagnosis present

## 2014-08-17 DIAGNOSIS — L97509 Non-pressure chronic ulcer of other part of unspecified foot with unspecified severity: Secondary | ICD-10-CM | POA: Diagnosis present

## 2014-08-17 DIAGNOSIS — L03115 Cellulitis of right lower limb: Secondary | ICD-10-CM | POA: Diagnosis present

## 2014-08-17 DIAGNOSIS — M79671 Pain in right foot: Secondary | ICD-10-CM | POA: Diagnosis present

## 2014-08-17 DIAGNOSIS — F250 Schizoaffective disorder, bipolar type: Secondary | ICD-10-CM | POA: Insufficient documentation

## 2014-08-17 DIAGNOSIS — F259 Schizoaffective disorder, unspecified: Secondary | ICD-10-CM | POA: Diagnosis present

## 2014-08-17 DIAGNOSIS — L03119 Cellulitis of unspecified part of limb: Secondary | ICD-10-CM

## 2014-08-17 DIAGNOSIS — B353 Tinea pedis: Secondary | ICD-10-CM | POA: Diagnosis present

## 2014-08-17 DIAGNOSIS — L02619 Cutaneous abscess of unspecified foot: Secondary | ICD-10-CM | POA: Diagnosis present

## 2014-08-17 DIAGNOSIS — L97519 Non-pressure chronic ulcer of other part of right foot with unspecified severity: Secondary | ICD-10-CM

## 2014-08-17 DIAGNOSIS — F17210 Nicotine dependence, cigarettes, uncomplicated: Secondary | ICD-10-CM | POA: Diagnosis present

## 2014-08-17 DIAGNOSIS — F79 Unspecified intellectual disabilities: Secondary | ICD-10-CM

## 2014-08-17 HISTORY — DX: Acne, unspecified: L70.9

## 2014-08-17 HISTORY — DX: Non-pressure chronic ulcer of other part of unspecified foot with unspecified severity: L97.509

## 2014-08-17 HISTORY — DX: Impulse disorder, unspecified: F63.9

## 2014-08-17 MED ORDER — HALOPERIDOL 5 MG PO TABS
5.0000 mg | ORAL_TABLET | Freq: Three times a day (TID) | ORAL | Status: DC
  Filled 2014-08-17 (×2): qty 1

## 2014-08-17 MED ORDER — NICOTINE 21 MG/24HR TD PT24: 21.0000 mg | MEDICATED_PATCH | Freq: Every day | TRANSDERMAL | Status: DC

## 2014-08-17 MED ORDER — HYDROCODONE-ACETAMINOPHEN 5-325 MG PO TABS: 1.0000 | ORAL_TABLET | ORAL | Status: DC | PRN

## 2014-08-17 MED ORDER — ACETAMINOPHEN 325 MG PO TABS: 650.0000 mg | ORAL_TABLET | Freq: Four times a day (QID) | ORAL | Status: DC | PRN

## 2014-08-17 MED ORDER — ENOXAPARIN SODIUM 40 MG/0.4ML ~~LOC~~ SOLN: 40.0000 mg | SUBCUTANEOUS | Status: DC

## 2014-08-17 MED ORDER — TRETINOIN 0.025 % EX CREA: TOPICAL_CREAM | Freq: Every day | CUTANEOUS | Status: DC

## 2014-08-17 MED ORDER — ACETAMINOPHEN 650 MG RE SUPP: 650.0000 mg | Freq: Four times a day (QID) | RECTAL | Status: DC | PRN

## 2014-08-17 MED ORDER — VANCOMYCIN HCL IN DEXTROSE 1-5 GM/200ML-% IV SOLN
1000.0000 mg | Freq: Once | INTRAVENOUS | Status: DC
  Filled 2014-08-17: qty 200

## 2014-08-17 NOTE — Discharge Summary (Signed)
Family Medicine Teaching [facility] Discharge Summary  Patient name: Thomas [name] Medical record number: [id number] Date of birth: Mar 23, 1986 Age: 29 y.o. Gender: male Date of Admission: 08/17/2014  Date of Discharge: 08/17/2014 Admitting Physician: Sanjuana Letters, MD  Primary Care Provider: Kevin Fenton, MD Consultants: None  Indication for Hospitalization: Osteomyelitis  Discharge Diagnoses/Problem List:  Interdigital maceration Schizoaffective disorder Mental Retardation  Disposition: Left Against Medical Advice   Discharge Condition: Stable  Discharge Exam:  Unable to obtain due to patient leaving the floor prior to MD arrival.   Brief Hospital Course:  On 2/11 pt. Was admitted to the hospital due to suspected osteomyelitis. He is a ward of the state and has a court appointed guardian. Pt. Was admitted and work up was attempted by staff. MD paged and updated that patient was refusing laboratory analysis, and stated that he was planning to leave the hospital. At that time, patient's guardian was not in the hospital or able to be located. MD arrived to floor shortly thereafter in order to assess the situation, however the patient had already walked off of the floor and was noted by nursing staff looking through the window to be walking on the street in front of the hospital. The patient's guardian had been called by nursing staff, however the patient had left prior to the guardian being able to talk with him as well. I spoke with the guardian at length about the situation. The guardian stated that he does not have capacity and that he is his appointed decision maker, however he had not planned for the patient to be admitted to the hospital and had thus left to care for the needs of his other dependents. He stated that he would try to find the patient and bring him back to the hospital, or call the police if need be to get help with getting the patient back to the hospital.  He instructed me not to call the police at this time, as he was going to try to come and get the patient. I informed the guardian that at this point the patient had left AMA prior to our being able to intervene to keep him here, and that he should bring him back to the ED where we can admit him again with a sitter in place to prevent a second flight. The caregiver acknowledged this, and voiced understanding. He said that he would try to get Thomas [name] and bring him back or call the police himself if he was unable to find him.   Nursing staff verified and witnessed the above conversation and events that transpired.   Issues for Follow Up:  1. Foot pain and treatment of osteomyelitis 2. Flight risk. Needs involuntary commitment and sitter. Security if needed to keep him in the hospital.   Significant Procedures: None  Significant Labs and Imaging:  No results for input(s): WBC, HGB, HCT, PLT in the last 168 hours. No results for input(s): NA, K, CL, CO2, GLUCOSE, BUN, CREATININE, CALCIUM, MG, PHOS, ALKPHOS, AST, ALT, ALBUMIN, PROTEIN in the last 168 hours.  Invalid input(s): TBILI  None  Results/Tests Pending at Time of Discharge: None  Discharge Medications:    Medication List    ASK your doctor about these medications        ALLEVYN AG GENTLE 2"X2" Pads  Apply small portion of the 2 x 2 pad between bilateral 4th and 5th toes daily.     cephALEXin 500 MG capsule  Commonly known as:  KEFLEX  Take 1 capsule (500 mg total) by mouth 3 (three) times daily.     haloperidol 5 MG tablet  Commonly known as:  HALDOL  Take 5 mg by mouth 3 (three) times daily.     minocycline 50 MG tablet  Commonly known as:  DYNACIN  Take 50 mg by mouth 2 (two) times daily.     tretinoin 0.025 % cream  Commonly known as:  RETIN-A  Apply topically at bedtime.        Discharge Instructions: Please refer to Patient Instructions section of EMR for full details.  Patient was counseled important signs  and symptoms that should prompt return to medical care, changes in medications, dietary instructions, activity restrictions, and follow up appointments.   Follow-Up Appointments:   Yolande Jollyaleb G Lynnette Pote, MD 08/17/2014, 4:53 PM PGY-1, [facility]

## 2014-08-17 NOTE — Progress Notes (Signed)
Patient refused treament and walked out voluntary after frequent urging and educating and calling contact from group home [facility]. Patient sdpoke with Kathlene NovemberMike on phone and still decided to leave floor. Physician notified of patient wanting to leave and ask to come to floor. As he arrived patient had stepped on elevator and he spoke with Barrington EllisonMike evans as well on the phone.  Patient left Alert and oriented x 4.

## 2014-08-17 NOTE — Assessment & Plan Note (Signed)
Ulceration with pain concerning for osteomyelitis. Patient will be admitted for IV Vanc, MRI, and lab work. Will likely need ortho consult pending results of MRI.   Precepted with Dr McDiarmid.

## 2014-08-17 NOTE — H&P (Signed)
Family Medicine Teaching [name] Admission History and Physical Service Pager: [phone]  Patient name: Thomas [name] Medical record number: [id number] Date of birth: April 07, 1986 Age: 29 y.o. Gender: male  Primary Care Provider: Kevin Fenton, MD Consultants: none Code Status: full  Chief Complaint: foot pain  Assessment and Plan: Thomas [name] is a 29 y.o. male presenting with foot pain, swelling and discharge. PMH is significant for interdigital maceration followed by podiatry, schizoaffective d/o, and MR.  # Foot ulcer: Cellulitis with abscess, possible osteo.  - start vanc  - MRI foot - consult ortho when MRI resulted - CBC, BMP A1c and HIV ordered  # Schizoaffective/MR: lives in group home.  - contact guardian in chart for any consent needed  # Tobacco abuse: Lives for smoking and may run if not allowed to smoke per group home staff - nicotine patch ordered - flight risk precautions  FEN/GI: regular diet, SLIV Prophylaxis: lovenox  Disposition: Admit to FMTS, Dr. Leveda [name] attending  History of Present Illness: Thomas [name] is a 29 y.o. male presenting with foot pain, swelling and purulent discharge worsening over the past 3 weeks. He is seen by podiatry for interdigital macerations thought to be related to tinea which have been improving. He was on keflex for this in December but did not have the swelling or ulceration at that time. He denies fever, chills, n/v/d, or any other systemic symptoms and feels well besides his foot pain.  Review Of Systems: Per HPI  Otherwise 12 point review of systems was performed and was unremarkable.  Patient Active Problem List   Diagnosis Date Noted  . Right foot ulcer 08/17/2014  . Tinea pedis 03/27/2014  . Bunion 03/27/2014  . SCHIZOAFFECTIVE DISORDER UNSPECIFIED 08/29/2009  . NICOTINE ADDICTION 08/29/2009  . IMPULSE CONTROL DISORDER 08/29/2009  . MENTAL RETARDATION, MODERATE 08/29/2009  . ACNE VULGARIS, CYSTIC, PUSTULAR  08/29/2009   Past Medical History: Past Medical History  Diagnosis Date  . Schizo-affective psychosis   . Mental retardation, mild (I.Q. 50-70)    Past Surgical History: No past surgical history on file. Social History: History  Substance Use Topics  . Smoking status: Current Every Day Smoker -- 0.50 packs/day  . Smokeless tobacco: Never Used  . Alcohol Use: No   Additional social history: lives in group home Please also refer to relevant sections of EMR.  Family History: No family history on file. Allergies and Medications: No Known Allergies No current facility-administered medications on file prior to encounter.   Current Outpatient Prescriptions on File Prior to Encounter  Medication Sig Dispense Refill  . cephALEXin (KEFLEX) 500 MG capsule Take 1 capsule (500 mg total) by mouth 3 (three) times daily. 30 capsule 2  . haloperidol (HALDOL) 5 MG tablet Take 5 mg by mouth 3 (three) times daily.     . minocycline (DYNACIN) 50 MG tablet Take 50 mg by mouth 2 (two) times daily.    Marland Kitchen Silver (ALLEVYN AG GENTLE) 2"X2" PADS Apply small portion of the 2 x 2 pad between bilateral 4th and 5th toes daily. 20 each 0  . tretinoin (RETIN-A) 0.025 % cream Apply topically at bedtime.      Objective: There were no vitals taken for this visit. Exam: General: Well appearing young man, sitting in chair, NAD HEENT: MMM, EOMI, PERRL, NCAT Cardiovascular: RRR, no MRG, 2+ dp and pt pulses bilaterally Respiratory: CTAB, normal WOB Abdomen: soft, NTND, normal BS Extremities: WWP, Right foot with mild interdigital maceration worse btw 4th and 5th toes. Just  proximal to this is a 4x3cm area of induration with some fluctuance and bloody/puruklent discharge from the center. Surrounding tenderness and tenderness over inferior surface at same location Skin: cystic acne Neuro: alert, no focal deficits, moves foot and toes on command without difficulty and sensation intact distal to ulcer  Labs and  Imaging: CBC BMET  No results for input(s): WBC, HGB, HCT, PLT in the last 168 hours. No results for input(s): NA, K, CL, CO2, BUN, CREATININE, GLUCOSE, CALCIUM in the last 168 hours.   Thomas SanderElena M Jamarria Real, MD 08/17/2014, 12:33 PM PGY-2, [facility] FPTS Intern pager: [phone], text pages welcome

## 2014-08-17 NOTE — Progress Notes (Signed)
ANTIBIOTIC CONSULT NOTE - INITIAL  Pharmacy Consult:  Vancomycin Indication:  Cellulitis, possible osteo  No Known Allergies  Patient Measurements: Height = 64 inches Weight = 66 kg  Vital Signs: Temp: 98.4 F (36.9 C) (02/11 1329) Temp Source: Oral (02/11 1329) BP: 153/96 mmHg (02/11 1329) Pulse Rate: 89 (02/11 1329)  Labs: No results for input(s): WBC, HGB, PLT, LABCREA, CREATININE in the last 72 hours. CrCl cannot be calculated (Patient has no serum creatinine result on file.). No results for input(s): VANCOTROUGH, VANCOPEAK, VANCORANDOM, GENTTROUGH, GENTPEAK, GENTRANDOM, TOBRATROUGH, TOBRAPEAK, TOBRARND, AMIKACINPEAK, AMIKACINTROU, AMIKACIN in the last 72 hours.   Microbiology: No results found for this or any previous visit (from the past 720 hour(s)).  Medical History: Past Medical History  Diagnosis Date  . Schizo-affective psychosis   . Mental retardation, mild (I.Q. 50-70)      Assessment: [age] YOM presented with complaint of foot pain.  Pharmacy consulted to initiate vancomycin for cellulitis with abscess and possible osteomyelitis.  Baseline labs pending collection.   Goal of Therapy:  Vancomycin trough level 15-20 mcg/ml   Plan:  - Vanc 1gm IV x 1 - F/U labs for further dosing - Monitor renal fxn, clinical progress, vanc trough as indicated   Derald Lorge D. Laney Potashang, PharmD, BCPS Pager:  [phone] 08/17/2014, 1:41 PM

## 2014-08-17 NOTE — Progress Notes (Signed)
Patient ID: Thomas [name], male   DOB: May 23, 1986, 29 y.o.   MRN: [id number]  Thomas AlarEric Mcclain Shall, MD Phone: [phone]  Thomas [name] is a 29 y.o. male who presents today for same day appointment.   Presents for concern of infection. Has been followed by [facility] for maceration between his toes. Was last treated with keflex in December. Notes this issue improved. Current issue started in mid January. Is on the top of the right foot over the 4th MTP joint. Notes it started off small and has grown. Has become more painful. Drains some pus and blood. No surrounding erythema, fevers, N/V, chills. No history of DM. Has not had a bone infection previously.  Patient is a smoker    ROS: Per HPI   Physical Exam Filed Vitals:   08/17/14 1105  BP: 135/86  Pulse: 95  Temp: 98.5 F (36.9 C)  Resp: 16    Gen: Well NAD Lungs: CTABL Nl WOB Heart: RRR  Right foot: ulceration and pus draining from this over the 4th MTP joint, there is surrounding flaking skin, there is minimal surrounding erythema, there is tenderness of the area, there is tenderness of the ball of the foot, he has intact sensation to light touch in the toes, 2+ DP pulses   Assessment/Plan: Please see individual problem list.  Thomas AlarEric Thomas Lisanti, MD [facility] PGY[facility]

## 2014-08-17 NOTE — Telephone Encounter (Addendum)
Mr. Thomas [name] states pt has bleeding and pus to dorsal foot.  Schedulers to get authorization from pt's primary doctor for Medicaid coverage.  Pt will be seen to day if Medicaid authorizes or will be referred to the ER.  Scheduler states pt's caregiver said pt is scheduled to see his primary doctor today, and offered him an appt with Dr. Irving ShowsEgerton this afternoon.

## 2014-08-21 ENCOUNTER — Ambulatory Visit (INDEPENDENT_AMBULATORY_CARE_PROVIDER_SITE_OTHER): Payer: Medicaid Other

## 2014-08-21 ENCOUNTER — Ambulatory Visit (INDEPENDENT_AMBULATORY_CARE_PROVIDER_SITE_OTHER): Payer: Medicaid Other | Admitting: Podiatry

## 2014-08-21 ENCOUNTER — Encounter: Payer: Self-pay | Admitting: Podiatry

## 2014-08-21 VITALS — BP 123/82 | HR 82 | Resp 12

## 2014-08-21 DIAGNOSIS — M79674 Pain in right toe(s): Secondary | ICD-10-CM

## 2014-08-21 DIAGNOSIS — L97509 Non-pressure chronic ulcer of other part of unspecified foot with unspecified severity: Secondary | ICD-10-CM

## 2014-08-21 DIAGNOSIS — R52 Pain, unspecified: Secondary | ICD-10-CM

## 2014-08-21 DIAGNOSIS — L97511 Non-pressure chronic ulcer of other part of right foot limited to breakdown of skin: Secondary | ICD-10-CM

## 2014-08-21 MED ORDER — CEPHALEXIN 500 MG PO CAPS: 500.0000 mg | ORAL_CAPSULE | Freq: Three times a day (TID) | ORAL | Status: DC

## 2014-08-21 NOTE — Progress Notes (Signed)
   Subjective:    Patient ID: Thomas [name], male    DOB: 1985/10/18, 29 y.o.   MRN: [id number]  HPI  PT STATED RT FOOT 4TH MET. HAVE AN OPEN WOUND AND BEEN PAINFUL FOR 3 WEEKS. THE FOOT IS GETTING BETTER, BUT GET AGGRAVATED BY WEARING SHOES. TRIED NEOSPORIN AND BANDAGE BUT NO HELP.  Review of Systems  All other systems reviewed and are negative.      Objective:   Physical Exam        Assessment & Plan:

## 2014-08-22 NOTE — Progress Notes (Signed)
Subjective:     Patient ID: Thomas [name], male   DOB: August 18, 1985, 29 y.o.   MRN: [id number]  HPI patient presents with caregiver who states that he's had some breakdown of tissue on top of his right foot that he wanted to have checked. States it does not hurt or has not caused him any other problems but they're concerned by the appearance   Review of Systems     Objective:   Physical Exam  Neurovascular status intact with patient who is not well oriented from a mental standpoint at this time who has a breakdown of tissue on the dorsum of the right fourth metatarsal measuring approximately 5 x 5 mm. It is localized with no subcutaneous exposure and there is no proximal edema erythema or drainage noted at this time. Does have some fissuring between his toes but it is localized    Assessment:     Localized irritation of the right forefoot and he may have it's this creating breakdown of superficial tissue    Plan:     Applied sterile dressing with Silvadene type cream and advised on soaks and Neosporin usage at home. Placed on cephalexin 500 mg 3 times a day and advised on not touching the area and he will be seen back in 2 weeks for reevaluation or earlier if any issues should occur

## 2014-09-06 ENCOUNTER — Ambulatory Visit: Payer: Medicaid Other | Admitting: Podiatry

## 2014-09-20 ENCOUNTER — Ambulatory Visit (INDEPENDENT_AMBULATORY_CARE_PROVIDER_SITE_OTHER): Payer: Medicaid Other | Admitting: Podiatry

## 2014-09-20 ENCOUNTER — Ambulatory Visit (INDEPENDENT_AMBULATORY_CARE_PROVIDER_SITE_OTHER): Payer: Medicaid Other

## 2014-09-20 ENCOUNTER — Encounter: Payer: Self-pay | Admitting: Podiatry

## 2014-09-20 VITALS — BP 121/88 | HR 91 | Resp 18

## 2014-09-20 DIAGNOSIS — L97511 Non-pressure chronic ulcer of other part of right foot limited to breakdown of skin: Secondary | ICD-10-CM | POA: Diagnosis not present

## 2014-09-20 DIAGNOSIS — L988 Other specified disorders of the skin and subcutaneous tissue: Secondary | ICD-10-CM

## 2014-09-20 DIAGNOSIS — R52 Pain, unspecified: Secondary | ICD-10-CM

## 2014-09-20 NOTE — Progress Notes (Signed)
Patient ID: Thomas CrockerWillis [name], male   DOB: 22-Mar-1986, 29 y.o.   MRN: [id number]  Subjective: 29 year old male presents the office today with a caregiver for follow-up evaluation of ulceration and infection of the top of his right foot. Previously he was treated for interdigital maceration of the fourth interspaces which she states is cleared somewhat. Since my last evaluation he developed ulceration on the top of his right foot for which admission to the hospital as recommended however he declined. He is placed on PO antibiotics and he states that the wounds/infection has resolved. He has not been continued any medication interdigitally with a laceration. He denies any systemic complaints such as fevers, chills, nausea, vomiting this time. No other complaints at this time.  Objective: AAO 3, NAD Neurovascular status unchanged. On the dorsal aspect of the right foot on the fourth metatarsal head dorsally there is a small scab from prior ulceration. Upon debridement there is no underlying ulceration, drainage or clinical signs of infection. Surrounding this area there is a slight area of hyperpigmentation of the skin likely from chronic inflammation with some evidence of peeling skin from likely resolved edema. There is no overlying erythema, ascending cellulitis, fluctuance, crepitus, drainage, malodor, or any other clinical signs of infection. There is interdigital maceration to bilateral lower extremities on the fourth interspace with the left greater than right. No open lesions identified at this time bilaterally. No tenderness to bilateral lower extremities. No overlying edema, erythema, increase in warmth bilaterally. MMT 5/5, ROM WNL No open lesions bilaterally. No pain with calf compression, swelling, warmth, erythema.  Assessment: 29 year old male with apparent resolved infection right foot with continued interdigital maceration fourth interspace left greater than right.  Plan: -X-Rays were  obtained and reviewed. -At this time it does not appear to any clinical signs of infection and the wound and infection have resolved. Recommended continue close observation of the area for any reoccurrence and if there is any to call the office immediately. For the interspace maceration I recommended him to continue with Castellani's pain to this area daily. This was applied at today's appointment as well. Monitor for any changes or any worsening of symptoms. Follow-up in 3 weeks to recheck symptoms or sooner if any problems are to arise.

## 2014-09-20 NOTE — Patient Instructions (Signed)
Continue daily dressing changes as discussed. Dry between your toes daily and apply castellani's paint as discussed.  Monitor for any signs/symptoms of infection. Call the office immediately if any occur or go directly to the emergency room. Call with any questions/concerns.

## 2014-10-11 ENCOUNTER — Ambulatory Visit (INDEPENDENT_AMBULATORY_CARE_PROVIDER_SITE_OTHER): Payer: Medicaid Other | Admitting: Podiatry

## 2014-10-11 ENCOUNTER — Encounter: Payer: Self-pay | Admitting: Podiatry

## 2014-10-11 VITALS — BP 122/81 | HR 94 | Resp 18

## 2014-10-11 DIAGNOSIS — L988 Other specified disorders of the skin and subcutaneous tissue: Secondary | ICD-10-CM | POA: Diagnosis not present

## 2014-10-11 NOTE — Progress Notes (Signed)
Patient ID: Thomas [name], male   DOB: Feb 07, 1986, 29 y.o.   MRN: [id number]  Subjective: Mr. Pricilla [name] presents the office today with a caregiver for follow-up evaluation of interdigial maceration on both of his feet. He states that he has not been applying any medication in between his toes he does not dry between his toes after showerinHe does have the Castellani's paint which I have given him previously however he does not use it. He denies any swelling or any redness to the area denies any open lesions. Denies any systemic complaints as fevers, chills, nausea, vomiting. No other complaints at this time in no acute changes since last appointment.  Objective: AAO 3, NAD Neurovascular status unchanged. There is continued interdigital maceration to bilateral fourth interspaces. There is no edema, erythema, drainage, malodor. There is no open lesions at this time. There is no areas of tenderness to bilateral lower extremities. There is no overlying edema, erythema, increase in warmth to bilateral lower extremities. MMT 5/5, ROM WNL No open lesions bilaterally. No pain with calf compression, swelling, warmth, erythema.  Assessment:  29 year old with continued individual maceration bilateral fourth interspaces.  Plan: -Treatment options were discussed including alternatives, risks, complications  -At today's appointment Betadine dressings were applied interdigitally to help dry the area. I discussed the patient that he needs to on a daily basis after showering to dry thoroughly between his toes and apply Castellani's paint. If he does not apply this is not to help relieve the symptoms. Along this goes on the higher the chance of further infection.  -Monitor for any signs or symptoms of infection and directed to call the office immediately should any occur or go to the ER.  -Follow-up in 4 weeks or sooner if any problems are to arise. In the meantime encouraged to call the office with any questions,  concerns, change in symptoms.

## 2014-10-11 NOTE — Patient Instructions (Addendum)
After showering dry thoroughly between all your toes and apply the Castellani's paint. Continue Castellani's pain interdigitally every day between your 4th and 5th toes.  Monitor for any signs/symptoms of infection. Call the office immediately if any occur or go directly to the emergency room. Call with any questions/concerns.

## 2014-11-08 ENCOUNTER — Ambulatory Visit: Payer: Medicaid Other | Admitting: Podiatry

## 2015-04-02 ENCOUNTER — Ambulatory Visit (INDEPENDENT_AMBULATORY_CARE_PROVIDER_SITE_OTHER): Payer: Medicaid Other | Admitting: Family Medicine

## 2015-04-02 ENCOUNTER — Encounter: Payer: Self-pay | Admitting: Family Medicine

## 2015-04-02 VITALS — BP 133/85 | HR 79 | Temp 98.3°F | Ht 64.0 in | Wt 128.0 lb

## 2015-04-02 DIAGNOSIS — Z0000 Encounter for general adult medical examination without abnormal findings: Secondary | ICD-10-CM

## 2015-04-02 DIAGNOSIS — Z720 Tobacco use: Secondary | ICD-10-CM

## 2015-04-02 DIAGNOSIS — F79 Unspecified intellectual disabilities: Secondary | ICD-10-CM | POA: Diagnosis not present

## 2015-04-02 DIAGNOSIS — F17200 Nicotine dependence, unspecified, uncomplicated: Secondary | ICD-10-CM

## 2015-04-02 MED ORDER — NICOTINE 7 MG/24HR TD PT24: 7.0000 mg | MEDICATED_PATCH | Freq: Every day | TRANSDERMAL | Status: DC

## 2015-04-02 MED ORDER — NICOTINE 14 MG/24HR TD PT24: 14.0000 mg | MEDICATED_PATCH | Freq: Every day | TRANSDERMAL | Status: DC

## 2015-04-02 NOTE — Patient Instructions (Signed)
Start with  patches - one patch per day for 2 weeks. Start on your "quit date". Then go down to  patches  Be well, Dr. Pollie Meyer   Health Maintenance A healthy lifestyle and preventative care can promote health and wellness.  Maintain regular health, dental, and eye exams.  Eat a healthy diet. Foods like vegetables, fruits, whole grains, low-fat dairy products, and lean protein foods contain the nutrients you need and are low in calories. Decrease your intake of foods high in solid fats, added sugars, and salt. Get information about a proper diet from your health care provider, if necessary.  Regular physical exercise is one of the most important things you can do for your health. Most adults should get at least 150 minutes of moderate-intensity exercise (any activity that increases your heart rate and causes you to sweat) each week. In addition, most adults need muscle-strengthening exercises on 2 or more days a week.   Maintain a healthy weight. The body mass index (BMI) is a screening tool to identify possible weight problems. It provides an estimate of body fat based on height and weight. Your health care provider can find your BMI and can help you achieve or maintain a healthy weight. For males 20 years and older:  A BMI below 18.5 is considered underweight.  A BMI of 18.5 to 24.9 is normal.  A BMI of 25 to 29.9 is considered overweight.  A BMI of 30 and above is considered obese.  Maintain normal blood lipids and cholesterol by exercising and minimizing your intake of saturated fat. Eat a balanced diet with plenty of fruits and vegetables. Blood tests for lipids and cholesterol should begin at age 1 and be repeated every 5 years. If your lipid or cholesterol levels are high, you are over age 52, or you are at high risk for heart disease, you may need your cholesterol levels checked more frequently.Ongoing high lipid and cholesterol levels should be treated with medicines if  diet and exercise are not working.  If you smoke, find out from your health care provider how to quit. If you do not use tobacco, do not start.  Lung cancer screening is recommended for adults aged 55-80 years who are at high risk for developing lung cancer because of a history of smoking. A yearly low-dose CT scan of the lungs is recommended for people who have at least a 30-pack-year history of smoking and are current smokers or have quit within the past 15 years. A pack year of smoking is smoking an average of 1 pack of cigarettes a day for 1 year (for example, a 30-pack-year history of smoking could mean smoking 1 pack a day for 30 years or 2 packs a day for 15 years). Yearly screening should continue until the smoker has stopped smoking for at least 15 years. Yearly screening should be stopped for people who develop a health problem that would prevent them from having lung cancer treatment.  If you choose to drink alcohol, do not have more than 2 drinks per day. One drink is considered to be 12 oz (360 mL) of beer, 5 oz (150 mL) of wine, or 1.5 oz (45 mL) of liquor.  Avoid the use of street drugs. Do not share needles with anyone. Ask for help if you need support or instructions about stopping the use of drugs.  High blood pressure causes heart disease and increases the risk of stroke. Blood pressure should be checked at least every 1-2 years.  Ongoing high blood pressure should be treated with medicines if weight loss and exercise are not effective.  If you are 48-35 years old, ask your health care provider if you should take aspirin to prevent heart disease.  Diabetes screening involves taking a blood sample to check your fasting blood sugar level. This should be done once every 3 years after age 46 if you are at a normal weight and without risk factors for diabetes. Testing should be considered at a younger age or be carried out more frequently if you are overweight and have at least 1 risk  factor for diabetes.  Colorectal cancer can be detected and often prevented. Most routine colorectal cancer screening begins at the age of 59 and continues through age 59. However, your health care provider may recommend screening at an earlier age if you have risk factors for colon cancer. On a yearly basis, your health care provider may provide home test kits to check for hidden blood in the stool. A small camera at the end of a tube may be used to directly examine the colon (sigmoidoscopy or colonoscopy) to detect the earliest forms of colorectal cancer. Talk to your health care provider about this at age 29 when routine screening begins. A direct exam of the colon should be repeated every 5-10 years through age 60, unless early forms of precancerous polyps or small growths are found.  People who are at an increased risk for hepatitis B should be screened for this virus. You are considered at high risk for hepatitis B if:  You were born in a country where hepatitis B occurs often. Talk with your health care provider about which countries are considered high risk.  Your parents were born in a high-risk country and you have not received a shot to protect against hepatitis B (hepatitis B vaccine).  You have HIV or AIDS.  You use needles to inject street drugs.  You live with, or have sex with, someone who has hepatitis B.  You are a man who has sex with other men (MSM).  You get hemodialysis treatment.  You take certain medicines for conditions like cancer, organ transplantation, and autoimmune conditions.  Hepatitis C blood testing is recommended for all people born from [birth year] through 1964 and any individual with known risk factors for hepatitis C.  Healthy men should no longer receive prostate-specific antigen (PSA) blood tests as part of routine cancer screening. Talk to your health care provider about prostate cancer screening.  Testicular cancer screening is not recommended for  adolescents or adult males who have no symptoms. Screening includes self-exam, a health care provider exam, and other screening tests. Consult with your health care provider about any symptoms you have or any concerns you have about testicular cancer.  Practice safe sex. Use condoms and avoid high-risk sexual practices to reduce the spread of sexually transmitted infections (STIs).  You should be screened for STIs, including gonorrhea and chlamydia if:  You are sexually active and are younger than 24 years.  You are older than 24 years, and your health care provider tells you that you are at risk for this type of infection.  Your sexual activity has changed since you were last screened, and you are at an increased risk for chlamydia or gonorrhea. Ask your health care provider if you are at risk.  If you are at risk of being infected with HIV, it is recommended that you take a prescription medicine daily to prevent HIV infection. This  is called pre-exposure prophylaxis (PrEP). You are considered at risk if:  You are a man who has sex with other men (MSM).  You are a heterosexual man who is sexually active with multiple partners.  You take drugs by injection.  You are sexually active with a partner who has HIV.  Talk with your health care provider about whether you are at high risk of being infected with HIV. If you choose to begin PrEP, you should first be tested for HIV. You should then be tested every 3 months for as long as you are taking PrEP.  Use sunscreen. Apply sunscreen liberally and repeatedly throughout the day. You should seek shade when your shadow is shorter than you. Protect yourself by wearing long sleeves, pants, a wide-brimmed hat, and sunglasses year round whenever you are outdoors.  Tell your health care provider of new moles or changes in moles, especially if there is a change in shape or color. Also, tell your health care provider if a mole is larger than the size of a  pencil eraser.  A one-time screening for abdominal aortic aneurysm (AAA) and surgical repair of large AAAs by ultrasound is recommended for men aged 65-75 years who are current or former smokers.  Stay current with your vaccines (immunizations). Document Released: 12/20/2007 Document Revised: 06/28/2013 Document Reviewed: 11/18/2010 [facility] Patient Information 2015 [place], [place]. This information is not intended to replace advice given to you by your health care provider. Make sure you discuss any questions you have with your health care provider.

## 2015-04-02 NOTE — Progress Notes (Signed)
HPI:  Patient presents today for a well adult male exam, and to have FL2 signed. Patient has history of cognitive delay, acne, schizoaffective disorder. Lives in group home. Has two specialists: -Dr. Terri Piedra, Dermatologist -[facility], psychiatrist  Concerns today: none Sexual activity: denies any history of sexual activity, caregiver reports no opportunities for sex STD Screening: not indicated Diet: eats well, normal diet Smoking: 9 cigarettes per day, would like to quit Alcohol: denies Drugs: denies  ROS: See HPI  PMFSH:  cognitive delay, acne, schizoaffective disorder.  PHYSICAL EXAM: BP 133/85 mmHg  Pulse 79  Temp(Src) 98.3 F (36.8 C) (Oral)  Ht  (1.626 m)  Wt 128 lb (58.06 kg)  BMI 21.96 kg/m2 Gen: NAD, pleasant, cooperative HEENT: NCAT, PERRL, no palpable thyromegaly or anterior cervical lymphadenopathy Heart: RRR, no murmurs Lungs: CTAB, NWOB Abdomen: soft, nontender to palpation Neuro: grossly nonfocal, speech normal Skin: pustular acne noted on face  ASSESSMENT/PLAN:  # Health maintenance:  -STD screening: doubt utility as patient denies ever having sexual contact in the past -handout given on health maintenance topics  Mental retardation FL2 signed today  NICOTINE ADDICTION Discussed cessation techniques with patient. Will rx nicotine  patches x 2 weeks, then  patches x 2 weeks. Advised if patient does not stop smoking with patches to d/c them so he is not getting extra doses of nicotine. Caregiver and patient expressed understanding.    FOLLOW UP: F/u in 1 year for next CPE  [place] J. Pollie Meyer, MD [facility]

## 2015-04-03 NOTE — Assessment & Plan Note (Signed)
Discussed cessation techniques with patient. Will rx nicotine  patches x 2 weeks, then  patches x 2 weeks. Advised if patient does not stop smoking with patches to d/c them so he is not getting extra doses of nicotine. Caregiver and patient expressed understanding.

## 2015-04-03 NOTE — Assessment & Plan Note (Signed)
FL2 signed today

## 2015-04-23 ENCOUNTER — Telehealth: Payer: Self-pay | Admitting: Family Medicine

## 2015-04-23 ENCOUNTER — Other Ambulatory Visit: Payer: Self-pay | Admitting: Family Medicine

## 2015-04-23 DIAGNOSIS — L708 Other acne: Secondary | ICD-10-CM

## 2015-04-23 NOTE — Telephone Encounter (Signed)
Group Home manager called and needs a new referral for the patient to see Dr. Terri PiedraLupton so that he can get his refills. His old referral expired . jw

## 2015-04-23 NOTE — Telephone Encounter (Signed)
Sent!

## 2015-06-08 ENCOUNTER — Ambulatory Visit: Payer: Medicaid Other | Admitting: Podiatry

## 2015-08-08 ENCOUNTER — Ambulatory Visit (INDEPENDENT_AMBULATORY_CARE_PROVIDER_SITE_OTHER): Payer: Medicaid Other | Admitting: Family Medicine

## 2015-08-08 ENCOUNTER — Other Ambulatory Visit (HOSPITAL_COMMUNITY)
Admission: RE | Admit: 2015-08-08 | Discharge: 2015-08-08 | Disposition: A | Discharge status: Home / Self Care | Payer: Medicaid Other | Source: Ambulatory Visit | Attending: Family Medicine | Admitting: Family Medicine

## 2015-08-08 ENCOUNTER — Encounter: Payer: Self-pay | Admitting: Family Medicine

## 2015-08-08 VITALS — BP 115/68 | HR 104 | Temp 98.3°F | Wt 127.0 lb

## 2015-08-08 DIAGNOSIS — Z113 Encounter for screening for infections with a predominantly sexual mode of transmission: Secondary | ICD-10-CM | POA: Diagnosis present

## 2015-08-08 DIAGNOSIS — N492 Inflammatory disorders of scrotum: Secondary | ICD-10-CM

## 2015-08-08 DIAGNOSIS — N5089 Other specified disorders of the male genital organs: Secondary | ICD-10-CM

## 2015-08-08 MED ORDER — CLINDAMYCIN PHOSPHATE 1 % EX SOLN: Freq: Two times a day (BID) | CUTANEOUS | Status: DC

## 2015-08-08 MED ORDER — CHLORHEXIDINE GLUCONATE 4 % EX LIQD: Freq: Every day | CUTANEOUS | Status: DC | PRN

## 2015-08-08 NOTE — Progress Notes (Signed)
   Subjective:    Patient ID: Thomas [name], male    DOB: 1985/12/30, 30 y.o.   MRN: [id number]  HPI  Patient presents for Same Day Appointment  CC: boil on scrotum  # Boil on scrotum:  Present for "months" with active drainage  Not painful or tender  Located on left side of scrotum near groin, there are 2 spots that he has noticed that drain  Recently had several boils on buttocks area I&D'd and packed, this has been getting better/healing well ROS: no fevers, no chills, no penile discharge  Social Hx: current smoker  Review of Systems   See HPI for ROS.   Past medical history, surgical, family, and social history reviewed and updated in the EMR as appropriate.  Objective:  BP 115/68 mmHg  Pulse 104  Temp(Src) 98.3 F (36.8 C) (Oral)  Wt 127 lb (57.607 kg) Vitals and nursing note reviewed  General: no apparent distress  GU: left base of scrotum near junction of groin there is an approx 4mm open area with active pus drainage, no surrounding erythema, total area of induration is approx 1.5cm. When expressing some of this pus just superior to this spot was another area of drainage that was <52mm in size. No inguinal lymphadenopathy or ulcer appreciated  Assessment & Plan:   1. Boil of scrotum Has clinical appearance of hidradenitis, which would go along with his prior diagnosis in chart of pustular acne vulgaris and recent "boils" which could be HA. Will treat with hibiclens, clindamycin 1% solution x 2 weeks and have him follow up to evaluate response. Return precautions given for worsening, swelling, redness, pain as it is in a precarious spot (scrotum). May refer to urology if not improving to have this evaluated. Also checking STD testing today, though do not feel this is as likely given the appearance. - RPR - HIV antibody - Urine cytology ancillary only

## 2015-08-08 NOTE — Patient Instructions (Signed)
Use the hibiclens once daily while showering Use the clindamycin antibiotic solution twice a day for 2 weeks  If you develop pain, the area spreads, gets more swollen please return to clinic IMMEDIATELY or go to the ED.

## 2015-08-09 ENCOUNTER — Telehealth: Payer: Self-pay | Admitting: *Deleted

## 2015-08-09 LAB — URINE CYTOLOGY ANCILLARY ONLY
Chlamydia: NEGATIVE
Neisseria Gonorrhea: NEGATIVE

## 2015-08-09 LAB — HIV ANTIBODY (ROUTINE TESTING W REFLEX): HIV 1&2 Ab, 4th Generation: NONREACTIVE

## 2015-08-09 LAB — RPR

## 2015-08-09 NOTE — Telephone Encounter (Signed)
[name] Discussed with Dr. Waynetta Sandy and Marcelino Duster at [facility] [phone]) notified that Rx should read, "apply once daily" instead of apply as needed. Fredderick Severance, RN

## 2015-09-21 ENCOUNTER — Ambulatory Visit (INDEPENDENT_AMBULATORY_CARE_PROVIDER_SITE_OTHER): Payer: Medicaid Other | Admitting: Family Medicine

## 2015-09-21 ENCOUNTER — Encounter: Payer: Self-pay | Admitting: Family Medicine

## 2015-09-21 VITALS — BP 145/85 | HR 103 | Temp 97.6°F | Wt 136.0 lb

## 2015-09-21 DIAGNOSIS — L0231 Cutaneous abscess of buttock: Secondary | ICD-10-CM

## 2015-09-21 MED ORDER — CLINDAMYCIN PHOSPHATE 1 % EX SOLN: Freq: Two times a day (BID) | CUTANEOUS | Status: DC

## 2015-09-21 NOTE — Patient Instructions (Addendum)
Use warm compresses on the boil on your bottom, four times a day. I am concerned that it will need to be opened up with an incision, but you wanted to wait on that today. If this gets worse or more painful, please return here, or go to Urgent Care or the ED. Follow up next week, Monday or Tuesday, to see how it's doing.  Use clindamycin topically over buttocks and pelvic area/groin. Continue minocycline.   Hidradenitis Suppurativa Hidradenitis suppurativa is a long-term (chronic) skin disease that starts with blocked sweat glands or hair follicles. Bacteria may grow in these blocked openings of your skin. Hidradenitis suppurativa is like a severe form of acne that develops in areas of your body where acne would be unusual. It is most likely to affect the areas of your body where skin rubs against skin and becomes moist. This includes your:  Underarms.  Groin.  Genital areas.  Buttocks.  Upper thighs.  Breasts. Hidradenitis suppurativa may start out with small pimples. The pimples can develop into deep sores that break open (rupture) and drain pus. Over time your skin may thicken and become scarred. Hidradenitis suppurativa cannot be passed from person to person.  CAUSES  The exact cause of hidradenitis suppurativa is not known. This condition may be due to:  Male and male hormones. The condition is rare before and after puberty.  An overactive body defense system (immune system). Your immune system may overreact to the blocked hair follicles or sweat glands and cause swelling and pus-filled sores. RISK FACTORS You may have a higher risk of hidradenitis suppurativa if you:  Are a woman.  Are between ages 8311 and 6255.  Have a family history of hidradenitis suppurativa.  Have a personal history of acne.  Are overweight.  Smoke.  Take the drug lithium. SIGNS AND SYMPTOMS  The first signs of an outbreak are usually painful skin bumps that look like pimples. As the condition  progresses:  Skin bumps may get bigger and grow deeper into the skin.  Bumps under the skin may rupture and drain smelly pus.  Skin may become itchy and infected.  Skin may thicken and scar.  Drainage may continue through tunnels under the skin (fistulas).  Walking and moving your arms can become painful. DIAGNOSIS  Your health care provider may diagnose hidradenitis suppurativa based on your medical history and your signs and symptoms. A physical exam will also be done. You may need to see a health care provider who specializes in skin diseases (dermatologist). You may also have tests done to confirm the diagnosis. These can include:  Swabbing a sample of pus or drainage from your skin so it can be sent to the lab and tested for infection.  Blood tests to check for infection. TREATMENT  The same treatment will not work for everybody with hidradenitis suppurativa. Your treatment will depend on how severe your symptoms are. You may need to try several treatments to find what works best for you. Part of your treatment may include cleaning and bandaging (dressing) your wounds. You may also have to take medicines, such as the following:  Antibiotics.  Acne medicines.  Medicines to block or suppress the immune system.  A diabetes medicine (metformin) is sometimes used to treat this condition.  For women, birth control pills can sometimes help relieve symptoms. You may need surgery if you have a severe case of hidradenitis suppurativa that does not respond to medicine. Surgery may involve:   Using a laser to  clear the skin and remove hair follicles.  Opening and draining deep sores.  Removing the areas of skin that are diseased and scarred. HOME CARE INSTRUCTIONS  Learn as much as you can about your disease, and work closely with your health care providers.  Take medicines only as directed by your health care provider.  If you were prescribed an antibiotic medicine, finish it  all even if you start to feel better.  If you are overweight, losing weight may be very helpful. Try to reach and maintain a healthy weight.  Do not use any tobacco products, including cigarettes, chewing tobacco, or electronic cigarettes. If you need help quitting, ask your health care provider.  Do not shave the areas where you get hidradenitis suppurativa.  Do not wear deodorant.  Wear loose-fitting clothes.  Try not to overheat and get sweaty.  Take a daily bleach bath as directed by your health care provider.  Fill your bathtub halfway with water.  Pour in  cup of unscented household bleach.  Soak for 5-10 minutes.  Cover sore areas with a warm, clean washcloth (compress) for 5-10 minutes. SEEK MEDICAL CARE IF:   You have a flare-up of hidradenitis suppurativa.  You have chills or a fever.  You are having trouble controlling your symptoms at home.   This information is not intended to replace advice given to you by your health care provider. Make sure you discuss any questions you have with your health care provider.   Document Released: 02/05/2004 Document Revised: 07/14/2014 Document Reviewed: 09/23/2013 Elsevier Interactive Patient Education [employer]

## 2015-09-21 NOTE — Progress Notes (Signed)
Date of Visit: 09/21/2015   HPI:  Patient presents for follow up on abscess in groin area. Was seen on 2/1 by Dr. Waynetta SandyWight [facility] for 1.5cm boil of his left upper scrotum that was already spontaneously draining. Given rx for topical hibiclens and clindamycin. Patient reports not actually picking these up.  The area on his scrotum has healed well and is no longer painful or draining. About 2-3 weeks ago he started having pain in his left buttock. Has not had any fevers. Has pain with sitting and trying to sleep. Stooling and urinating normally. Eating and drinking well.   Previously patient had an abscess that required multiple visits to a local Urgent Care center (not in our system), with need for drainage and repeated packing.  ROS: See HPI.  PMFSH: history of cystic acne, foot cellulitis/abscess, impulse control disorder, moderate mental retardation, nicotine addiction, schizoaffective disorder  PHYSICAL EXAM: BP 145/85 mmHg  Pulse 103  Temp(Src) 97.6 F (36.4 C) (Oral)  Wt 136 lb (61.689 kg) Gen: NAD, pleasant, cooperative, systemically well appearing GU: normal appearing external male genitalia. Area of slight induration at left upper scrotum, no drainage, erythema, or tenderness in this area. No skin breakdown. Buttock: tender area in left upper medial buttock that is erythematous and indurated, approximately 4cm in diameter total. No significant drainage presently. Skin: face with chronic changes of cystic acne  ASSESSMENT/PLAN:  1. Buttock abscess - presentation clinically consistent with abscess of buttock. Suspect ongoing hidradenitis given his severe acne scarring and history of boils in the past. Discussed recommendation for I&D of abscess today, to aide in healing. Patient would really like to avoid I&D if at all possible. Discussed risks and benefits of not performing I&D today. Patient has capacity to make decisions and is able to follow up closely  next week for repeat assessment. It is reasonable to try warm compresses etc given that he is systemically well. He is already on oral antibiotics (minocycline).  Plan: - warm compresses four times a day  - clindamycin solution to the area - follow up next week on Monday or Tuesday to assess the area, may need drainage if not improved at that time - discussed return precautions extensively, handout given  FOLLOW UP: Follow up early next week for buttock abscess  GrenadaBrittany J. Pollie MeyerMcIntyre, MD [facility]

## 2015-09-24 ENCOUNTER — Ambulatory Visit: Payer: Medicaid Other | Admitting: Family Medicine

## 2015-09-25 ENCOUNTER — Ambulatory Visit (INDEPENDENT_AMBULATORY_CARE_PROVIDER_SITE_OTHER): Payer: Medicaid Other | Admitting: Family Medicine

## 2015-09-25 ENCOUNTER — Encounter: Payer: Self-pay | Admitting: Family Medicine

## 2015-09-25 VITALS — BP 136/92 | HR 108 | Temp 98.6°F | Wt 133.0 lb

## 2015-09-25 DIAGNOSIS — L0231 Cutaneous abscess of buttock: Secondary | ICD-10-CM | POA: Diagnosis present

## 2015-09-25 NOTE — Progress Notes (Signed)
Date of Visit: 09/25/2015   HPI:  Patient presents for follow up of left buttock abscess. I saw him on Friday 3/17 and recommended I&D but he declined, preferring conservative treatment with warm compresses and topical clindamycin. He is on minocycline for chronic cystic acne.  Patient reports he has not used any of the warm compresses since being seen on Friday. He did get the topical clindamycin and has been applying it. Reports the area is now draining. Would still really like to avoid I&D as in the past it has been very painful.  ROS: See HPI.  PMFSH: history of cystic acne, foot cellulitis/abscess, impulse control disorder, moderate mental retardation, nicotine addiction, schizoaffective disorder  PHYSICAL EXAM: BP 136/92 mmHg  Pulse 108  Temp(Src) 98.6 F (37 C) (Oral)  Wt 133 lb (60.328 kg) Gen: NAD, pleasant, cooperative HEENT: normocephalic, atraumatic  Skin: left upper medial buttock with 2-3cm of induration and very mild fluctuance. Spontaneously draining purulent material. No overlying erythema. The area is tender.   ASSESSMENT/PLAN:  1. Left buttock abscess - now spontaneously draining. Again recommended I&D to open up the area further, but patient continues to decline this procedure. Emphasized importance of utilizing warm compresses, this is essential to promote continued drainage. Will continue topical clindamycin. Discussed risks of worsening including need for hospitalization, IV antibiotics, and surgery. Will have patient follow up in 2-3 days for recheck.  FOLLOW UP: Follow up in 2-3 days for recheck of left buttock abscess.  GrenadaBrittany J. Pollie MeyerMcIntyre, MD [facility]

## 2015-09-25 NOTE — Patient Instructions (Addendum)
Do the warm compresses four times a day. This is very important! Continue clindamycin Return in 2-3 days to have this rechecked.  Go to ED or urgent care, or call us, if you have worsening pain, redness, fevers, or swelling.  Be well, Dr. Pollie MeyerMcIntyre

## 2015-09-27 ENCOUNTER — Ambulatory Visit (INDEPENDENT_AMBULATORY_CARE_PROVIDER_SITE_OTHER): Payer: Medicaid Other | Admitting: Family Medicine

## 2015-09-27 ENCOUNTER — Encounter: Payer: Self-pay | Admitting: Family Medicine

## 2015-09-27 VITALS — BP 137/85 | HR 99 | Temp 98.3°F | Ht 64.0 in | Wt 136.1 lb

## 2015-09-27 DIAGNOSIS — L0231 Cutaneous abscess of buttock: Secondary | ICD-10-CM

## 2015-09-27 NOTE — Progress Notes (Signed)
Date of Visit: 09/27/2015   HPI:  Patient presents to follow up on boil of buttock. Has been doing warm compresses four times a day. Also using topical clindamycin. Thinks it's doing better. Still draining a little bit. No fevers. Eating and drinking well.   ROS: See HPI.  PMFSH: history of cystic acne, moderate mental retardation, schizoaffective disorder  PHYSICAL EXAM: BP 137/85 mmHg  Pulse 99  Temp(Src) 98.3 F (36.8 C) (Oral)  Ht 5\' 4"  (1.626 m)  Wt 136 lb 1.6 oz (61.735 kg)  BMI 23.35 kg/m2 Gen: NAD, pleasant, cooperative, well appearing Skin: L upper medial buttock still with open area approximately 4mm of opening, with 1-2cm of surrounding induration. No fluctuance. Minimally tender. No erythema. No warmth.  ASSESSMENT/PLAN:  1. Left buttock abscess - improving with conservative management. Continue warm compresses and topical clindamycin until resolved. No need to see him here again for this unless it worsens or fails to continue improving. Patient and group home caregiver appreciative and agreeable to this plan.  FOLLOW UP: Follow up as needed if symptoms worsen or fail to improve.    GrenadaBrittany J. Pollie MeyerMcIntyre, MD [facility]

## 2015-09-27 NOTE — Patient Instructions (Signed)
Continue clindamycin and warm compresses four times daily until you think this is totally resolved  Follow up if it gets worse, fevers, increased redness, pain etc.  Be well, Dr. Pollie MeyerMcIntyre

## 2016-04-07 ENCOUNTER — Encounter: Payer: Self-pay | Admitting: Family Medicine

## 2016-04-07 ENCOUNTER — Ambulatory Visit (INDEPENDENT_AMBULATORY_CARE_PROVIDER_SITE_OTHER): Payer: Medicaid Other | Admitting: Family Medicine

## 2016-04-07 VITALS — BP 128/90 | HR 96 | Temp 97.8°F | Ht 64.0 in | Wt 154.0 lb

## 2016-04-07 DIAGNOSIS — Z23 Encounter for immunization: Secondary | ICD-10-CM | POA: Diagnosis present

## 2016-04-07 DIAGNOSIS — Z0000 Encounter for general adult medical examination without abnormal findings: Secondary | ICD-10-CM | POA: Insufficient documentation

## 2016-04-07 DIAGNOSIS — Z79899 Other long term (current) drug therapy: Secondary | ICD-10-CM

## 2016-04-07 DIAGNOSIS — Z789 Other specified health status: Secondary | ICD-10-CM

## 2016-04-07 DIAGNOSIS — Z593 Problems related to living in residential institution: Secondary | ICD-10-CM | POA: Insufficient documentation

## 2016-04-07 DIAGNOSIS — L708 Other acne: Secondary | ICD-10-CM

## 2016-04-07 DIAGNOSIS — F250 Schizoaffective disorder, bipolar type: Secondary | ICD-10-CM

## 2016-04-07 LAB — POCT UA - MICROSCOPIC ONLY

## 2016-04-07 LAB — COMPLETE METABOLIC PANEL WITH GFR
ALT: 35 U/L (ref 9–46)
AST: 26 U/L (ref 10–40)
Albumin: 4.1 g/dL (ref 3.6–5.1)
Alkaline Phosphatase: 144 U/L — ABNORMAL HIGH (ref 40–115)
BUN: 9 mg/dL (ref 7–25)
CO2: 26 mmol/L (ref 20–31)
CREATININE: 0.86 mg/dL (ref 0.60–1.35)
Calcium: 9.4 mg/dL (ref 8.6–10.3)
Chloride: 106 mmol/L (ref 98–110)
GFR, Est Non African American: >89 mL/min (ref >=60)
GLUCOSE: 90 mg/dL (ref 65–99)
Potassium: 4.2 mmol/L (ref 3.5–5.3)
Sodium: 140 mmol/L (ref 135–146)
Total Bilirubin: 0.3 mg/dL (ref 0.2–1.2)
Total Protein: 7.5 g/dL (ref 6.1–8.1)

## 2016-04-07 LAB — CBC WITH DIFFERENTIAL/PLATELET
BASOS ABS: 0 {cells}/uL (ref 0–200)
Basophils Relative: 0 %
Eosinophils Absolute: 116 cells/uL (ref 15–500)
Eosinophils Relative: 1 %
HEMATOCRIT: 45 % (ref 38.5–50.0)
HEMOGLOBIN: 15.1 g/dL (ref 13.2–17.1)
LYMPHS ABS: 2784 {cells}/uL (ref 850–3900)
Lymphocytes Relative: 24 %
MCH: 28.9 pg (ref 27.0–33.0)
MCHC: 33.6 g/dL (ref 32.0–36.0)
MCV: 86 fL (ref 80.0–100.0)
MPV: 10.5 fL (ref 7.5–12.5)
Monocytes Absolute: 580 cells/uL (ref 200–950)
Monocytes Relative: 5 %
Neutro Abs: 8120 cells/uL — ABNORMAL HIGH (ref 1500–7800)
Neutrophils Relative %: 70 %
Platelets: 253 10*3/uL (ref 140–400)
RBC: 5.23 MIL/uL (ref 4.20–5.80)
RDW: 14.7 % (ref 11.0–15.0)
WBC: 11.6 10*3/uL — ABNORMAL HIGH (ref 3.8–10.8)

## 2016-04-07 LAB — TSH: TSH: 0.95 m[IU]/L (ref 0.40–4.50)

## 2016-04-07 LAB — LIPID PANEL
Cholesterol: 217 mg/dL — ABNORMAL HIGH (ref 125–200)
HDL: 31 mg/dL — ABNORMAL LOW (ref >=40)
LDL CALC: 131 mg/dL — AB (ref <130)
TRIGLYCERIDES: 275 mg/dL — AB (ref <150)
Total CHOL/HDL Ratio: 7 Ratio — ABNORMAL HIGH (ref <=5.0)
VLDL: 55 mg/dL — AB (ref <30)

## 2016-04-07 LAB — POCT URINALYSIS DIPSTICK
Bilirubin, UA: NEGATIVE
GLUCOSE UA: NEGATIVE
Ketones, UA: NEGATIVE
Leukocytes, UA: NEGATIVE
NITRITE UA: NEGATIVE
PH UA: 7.5
Protein, UA: NEGATIVE
SPEC GRAV UA: 1.02
UROBILINOGEN UA: 0.2

## 2016-04-07 LAB — POCT GLYCOSYLATED HEMOGLOBIN (HGB A1C): Hemoglobin A1C: 5.5

## 2016-04-07 LAB — VITAMIN B12: Vitamin B-12: 959 pg/mL (ref 200–1100)

## 2016-04-07 NOTE — Assessment & Plan Note (Signed)
Stable: Patient endorses no issues at this time. - Appears to be stable on Haldol and Zyprexa.

## 2016-04-07 NOTE — Patient Instructions (Signed)
It was a pleasure seeing you today in our clinic. Today we discussed your medications. Here is the treatment plan we have discussed and agreed upon together:   - Everything looks great! Come back and see me in a year or sooner if something comes up!

## 2016-04-07 NOTE — Assessment & Plan Note (Signed)
Patient status appears to be relatively stable at this time. - We'll obtain labs to assess for any unseen abnormalities due to the high-risk medications that he is currently on chronically.

## 2016-04-07 NOTE — Assessment & Plan Note (Signed)
Patient currently on minocycline. He is not taking any of the topical agents at this time. He is pleased with his current status and does not wish to change his current regimen. - Continue minocycline

## 2016-04-07 NOTE — Progress Notes (Signed)
Mickie Hillier, MD, MS Phone: [phone]  Subjective:  CC -- Annual Physical;  Pt reports he has been doing well. He has no complaints of this time. He states that his psychological disease is under good control and he has not had any recent issues.  Cardiovascular: - Dx Hypertension: no  - Dx Hyperlipidemia: no  - Dx Obesity: no - Physical Activity: yes, lifts weights regularly  - Diabetes: no   Cancer: Colorectal >> Colonoscopy: no  Lung >> Tobacco Use: yes, smokes 8 cigarettes a day; not interested in quitting at this time   - If so, previous Low-Dose CT screen: no  Prostate >> Interested in DRE and/or PSA: no  Skin >> Suspicious lesions: no   Social: Alcohol Use: no  Tobacco Use: yes, 8 cigarettes a day   - Interested in Quitting: no  Other Drugs: no  Risky Sexual Behavior: no  Support and Life at Home: yes   Other: Osteoporosis: no  Zoster Vaccine: no Flu Vaccine: no, refused today  Pneumonia Vaccine: no  ROS- no recent fever, chills, headache, confusion, dizziness, blurred vision, shortness of breath, chest pain, dysphagia, abdominal pain, nausea, vomiting, diarrhea, dysuria, weakness, numbness, or paresthesias.  Past Medical History Patient Active Problem List   Diagnosis Date Noted  . Lives in group home 04/07/2016  . Health maintenance examination 04/07/2016  . Right foot ulcer (HCC) 08/17/2014  . Schizoaffective disorder, bipolar type (HCC)   . Mental retardation   . Tinea pedis 03/27/2014  . Bunion 03/27/2014  . SCHIZOAFFECTIVE DISORDER UNSPECIFIED 08/29/2009  . NICOTINE ADDICTION 08/29/2009  . IMPULSE CONTROL DISORDER 08/29/2009  . MENTAL RETARDATION, MODERATE 08/29/2009  . 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 haloperidol (HALDOL) 5 MG tablet Take 5 mg by mouth at bedtime.     . minocycline (DYNACIN) 50 MG tablet Take 50 mg by mouth 2  (two) times daily.    Marland Kitchen OLANZapine (ZYPREXA) 10 MG tablet Take 10 mg by mouth at bedtime.    Marland Kitchen Silver (ALLEVYN AG GENTLE) 2"X2" PADS Apply small portion of the 2 x 2 pad between bilateral 4th and 5th toes daily. 20 each 0   No current facility-administered medications for this visit.     Objective: BP 128/90   Pulse 96   Temp 97.8 F (36.6 C) (Oral)   Ht 5\' 4"  (1.626 m)   Wt 154 lb (69.9 kg)   BMI 26.43 kg/m  Gen: NAD, alert, cooperative with exam HEENT: NCAT, EOMI, PERRL; obvious evidence of facial scarring secondary to acne. MMM, no LAD, OP clear CV: RRR, good S1/S2, no murmur Resp: CTABL, no wheezes, non-labored Abd: Soft, Non Tender, Non Distended, BS present, no guarding or organomegaly Ext: No edema, warm Neuro: Alert and oriented, No gross deficits   Assessment/Plan:  Schizoaffective disorder, bipolar type Stable: Patient endorses no issues at this time. - Appears to be stable on Haldol and Zyprexa.  ACNE VULGARIS, CYSTIC, PUSTULAR Patient currently on minocycline. He is not taking any of the topical agents at this time. He is pleased with his current status and does not wish to change his current regimen. - Continue minocycline  Health maintenance examination Patient status appears to be relatively stable at this time. - We'll obtain labs to assess for any unseen abnormalities due to the high-risk medications that he is currently on chronically.   Orders Placed This Encounter  Procedures  . Tdap  vaccine greater than or equal to 7yo IM  . CBC with Differential/Platelet  . COMPLETE METABOLIC PANEL WITH GFR  . Lipid panel  . TSH  . VITAMIN D 25 Hydroxy (Vit-D Deficiency, Fractures)  . Vitamin B12  . POCT urinalysis dipstick  . POCT glycosylated hemoglobin (Hb A1C)  . POCT UA - Microscopic Only    Kathee DeltonIan D McKeag, MD,MS,  PGY2 04/07/2016 8:34 PM

## 2016-04-08 LAB — VITAMIN D 25 HYDROXY (VIT D DEFICIENCY, FRACTURES): VIT D 25 HYDROXY: 23 ng/mL — AB (ref 30–100)

## 2017-03-23 ENCOUNTER — Ambulatory Visit (INDEPENDENT_AMBULATORY_CARE_PROVIDER_SITE_OTHER): Payer: Medicaid Other | Admitting: Internal Medicine

## 2017-03-23 ENCOUNTER — Encounter: Payer: Self-pay | Admitting: Internal Medicine

## 2017-03-23 VITALS — BP 120/70 | HR 87 | Temp 98.6°F | Ht 64.0 in | Wt 157.0 lb

## 2017-03-23 DIAGNOSIS — Z9229 Personal history of other drug therapy: Secondary | ICD-10-CM | POA: Diagnosis not present

## 2017-03-23 DIAGNOSIS — Z593 Problems related to living in residential institution: Secondary | ICD-10-CM

## 2017-03-23 DIAGNOSIS — Z0000 Encounter for general adult medical examination without abnormal findings: Secondary | ICD-10-CM | POA: Diagnosis not present

## 2017-03-23 DIAGNOSIS — Z5181 Encounter for therapeutic drug level monitoring: Secondary | ICD-10-CM | POA: Diagnosis not present

## 2017-03-23 DIAGNOSIS — F250 Schizoaffective disorder, bipolar type: Secondary | ICD-10-CM

## 2017-03-23 DIAGNOSIS — L708 Other acne: Secondary | ICD-10-CM | POA: Diagnosis not present

## 2017-03-23 DIAGNOSIS — Z716 Tobacco abuse counseling: Secondary | ICD-10-CM

## 2017-03-23 DIAGNOSIS — E785 Hyperlipidemia, unspecified: Secondary | ICD-10-CM | POA: Diagnosis not present

## 2017-03-23 LAB — POCT GLYCOSYLATED HEMOGLOBIN (HGB A1C): Hemoglobin A1C: 5.5

## 2017-03-23 NOTE — Patient Instructions (Signed)
Thomas [name],  I have ordered some labs because of some of the medication you are on. I will send the results to you and to your other doctor.  For acne, I would recommend stopping the minocycline to see how you do. If your skin does not flare up, you may be able to stop this medication entirely.  Continue to keep physically active and try to cut back on snacks like chips and cookies.  Congratulations on thinking about quitting smoking! Your plan is to work on cutting back. Please see [place] back if you would like help with quitting.  Best, Dr. Sampson Goon
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
## 2017-03-24 DIAGNOSIS — Z716 Tobacco abuse counseling: Secondary | ICD-10-CM | POA: Insufficient documentation

## 2017-03-24 LAB — COMPREHENSIVE METABOLIC PANEL
A/G RATIO: 1.1 — AB (ref 1.2–2.2)
ALK PHOS: 168 IU/L — AB (ref 39–117)
ALT: 33 IU/L (ref 0–44)
AST: 27 IU/L (ref 0–40)
Albumin: 4.3 g/dL (ref 3.5–5.5)
BILIRUBIN TOTAL: 0.3 mg/dL (ref 0.0–1.2)
BUN/Creatinine Ratio: 9 (ref 9–20)
BUN: 8 mg/dL (ref 6–20)
CHLORIDE: 104 mmol/L (ref 96–106)
CO2: 25 mmol/L (ref 20–29)
CREATININE: 0.86 mg/dL (ref 0.76–1.27)
Calcium: 9.7 mg/dL (ref 8.7–10.2)
GFR calc Af Amer: 135 mL/min/{1.73_m2} (ref >59)
GFR calc non Af Amer: 117 mL/min/{1.73_m2} (ref >59)
GLOBULIN, TOTAL: 3.8 g/dL (ref 1.5–4.5)
Glucose: 78 mg/dL (ref 65–99)
POTASSIUM: 4 mmol/L (ref 3.5–5.2)
SODIUM: 144 mmol/L (ref 134–144)
Total Protein: 8.1 g/dL (ref 6.0–8.5)

## 2017-03-24 LAB — LIPID PANEL
CHOLESTEROL TOTAL: 229 mg/dL — AB (ref 100–199)
Chol/HDL Ratio: 7.2 ratio — ABNORMAL HIGH (ref 0.0–5.0)
HDL: 32 mg/dL — AB (ref >39)
LDL Calculated: 150 mg/dL — ABNORMAL HIGH (ref 0–99)
TRIGLYCERIDES: 234 mg/dL — AB (ref 0–149)
VLDL Cholesterol Cal: 47 mg/dL — ABNORMAL HIGH (ref 5–40)

## 2017-03-24 NOTE — Assessment & Plan Note (Signed)
Completed FL2 form 

## 2017-03-24 NOTE — Assessment & Plan Note (Addendum)
-   Has been on antibiotics for many years. Recommended trial off medication as no active pustules. Restart if needed.

## 2017-03-24 NOTE — Assessment & Plan Note (Signed)
-   Ordered lipid panel, A1c, and CMP as part of medication monitoring.

## 2017-03-24 NOTE — Assessment & Plan Note (Signed)
-   Patient not yet ready to quit but plans to cut back.  - Recommended he return for more counseling once ready to try quitting.

## 2017-03-25 ENCOUNTER — Encounter: Payer: Self-pay | Admitting: Internal Medicine

## 2018-02-02 ENCOUNTER — Telehealth: Payer: Self-pay | Admitting: *Deleted

## 2018-02-02 NOTE — Telephone Encounter (Signed)
Pt has history of boils.  Has boil on bottom at the moment and caregiver said that he previously has refused to have it lanced and packed but is agreeable to plan now.  Unfortunately, no available appts today or tomorrow.  He will take to urgent care. Delyle Weider, Maryjo RochesterJessica Dawn, CMA

## 2018-02-03 NOTE — Telephone Encounter (Signed)
Noted. Thank you.  Candiace West, DO [facility], PGY-3  

## 2018-03-25 ENCOUNTER — Ambulatory Visit (INDEPENDENT_AMBULATORY_CARE_PROVIDER_SITE_OTHER): Payer: Medicaid Other | Admitting: Family Medicine

## 2018-03-25 ENCOUNTER — Encounter: Payer: Self-pay | Admitting: Family Medicine

## 2018-03-25 VITALS — BP 123/80 | HR 79 | Temp 98.8°F | Wt 143.8 lb

## 2018-03-25 DIAGNOSIS — Z0000 Encounter for general adult medical examination without abnormal findings: Secondary | ICD-10-CM

## 2018-03-25 DIAGNOSIS — F250 Schizoaffective disorder, bipolar type: Secondary | ICD-10-CM | POA: Diagnosis not present

## 2018-03-25 DIAGNOSIS — F17200 Nicotine dependence, unspecified, uncomplicated: Secondary | ICD-10-CM | POA: Insufficient documentation

## 2018-03-25 NOTE — Progress Notes (Deleted)
   Subjective   Patient ID: Thomas [name]    DOB: [birth year], 32 y.o. male   MRN: [id number]  CC: "***"  HPI: Thomas [name] is a 32 y.o. male who presents to clinic today for the following:  ***: ***  ROS: see HPI for pertinent.  PMFSH: Schizoaffective disorder, tobacco use disorder, acne.  Surgical history unremarkable.  Family history unremarkable.  Smoking status reviewed. Medications reviewed.  Objective   There were no vitals taken for this visit. Vitals and nursing note reviewed.  General: well nourished, well developed, NAD with non-toxic appearance HEENT: normocephalic, atraumatic, moist mucous membranes Neck: supple, non-tender without lymphadenopathy Cardiovascular: regular rate and rhythm without murmurs, rubs, or gallops Lungs: clear to auscultation bilaterally with normal work of breathing Abdomen: soft, non-tender, non-distended, normoactive bowel sounds Skin: warm, dry, no rashes or lesions, cap refill < 2 seconds Extremities: warm and well perfused, normal tone, no edema  Assessment & Plan   No problem-specific Assessment & Plan notes found for this encounter.  No orders of the defined types were placed in this encounter.  No orders of the defined types were placed in this encounter.   Durward Parcelavid McMullen, DO [facility], PGY-3 03/25/2018, 8:16 AM

## 2018-03-25 NOTE — Progress Notes (Addendum)
    Subjective:  Thomas [name] is a 32 y.o. male who [facility] today for an annual physical. Manager of group home is present for exam.    HPI: Patient presents today for his annual physical which is required by the group home where he resides. Patient denies any recent illness. Reports issue with abscess that required drainage. Patient reports he smokes 8 cigarettes a day and has been since he was 318-31 years old. Voices desire to quit "but not right now".   Denies fever, chills, HA, changes in hearing, SOB, CP, NVD, constipation, edema in extremities.   Reports occasional blurry vision, occasional runny, cough, congestion. Patient feels like these symptoms are due to seasonal allergies and feels they are manageable without treatment   Objective:  Physical Exam: BP 123/80 (BP Location: Left Arm, Patient Position: Sitting, Cuff Size: Normal)   Pulse 79   Temp 98.8 F (37.1 C) (Oral)   Wt 143 lb 12.8 oz (65.2 kg)   SpO2 100%   BMI 24.68 kg/m   Gen: NAD, resting comfortably, cooperative for exam HEENT: head atraumatic, tympanic membranes non-erythematous BL, Nasal turbinated moderately erythematous, poor dentition with dental carries on right lower molar. Visual acuity shower 20/40 bilaterally and patient sees and opthalmologist   CV: RRR with no murmurs appreciated Pulm: NWOB, CTAB with no crackles, wheezes, or rhonchi GI: Normal bowel sounds present. Soft, Nontender, Nondistended. MSK: no edema, cyanosis, or clubbing noted Skin: warm, dry, scaring on face from cystic acne. No active lesions  Neuro: grossly normal, moves all extremities Psych: Normal affect and thought content   Assessment/Plan:  Thomas [name] is a 32 y.o. male who [facility] for [facility]. Manager of group home is present for exam.    Health Maintenance  -- Patient counseled on smoking cessation  -- Flu shot received  -- Psychiatric medications managed by outpatient  psychiatrist with appointment next month. -- Patient encouraged to contact the office if any need for allergy medications    John Derrel NipVictor Cresenzo, Ms4 [employer] [employer]  03/25/2018 11:06 AM  I have personally seen and examined this patient with Marcheta GrammesJohn Cresenzo and agree with the above note. The following is my additional documentation.   Physical Exam: General: well nourished, well developed, NAD with non-toxic appearance HEENT: normocephalic, atraumatic, moist mucous membranes Neck: supple, non-tender without lymphadenopathy Cardiovascular: regular rate and rhythm without murmurs, rubs, or gallops Lungs: clear to auscultation bilaterally with normal work of breathing Abdomen: soft, non-tender, non-distended, normoactive bowel sounds Skin: warm, dry, significant facial scarring from acne, cap refill < 2 seconds Extremities: warm and well perfused, normal tone, no edema  Assessment/Plan Thomas [name] is a 32 y.o. Male presenting for annual physical exam.  Has no complaints today.  Annual physical exam: Patient is without complaints today.  He is a current everyday smoker.  He is up-to-date on his vaccinations.  Tobacco use disorder: Patient is a current everyday smoker with approximately 8 cigarettes/day.  He is not ready to quit yet.  He is without cough, chest pain, shortness of breath, unexplained weight loss.  Schizoaffective disorder: Chronic.  Stable on Haldol and Zyprexa.  [facility].  Has appointment tomorrow.  Durward Parcelavid McMullen, DO [facility], PGY-3

## 2018-03-25 NOTE — Patient Instructions (Signed)
Thank you for coming in to see Thomas [name] today. Please see below to review our plan for today's visit.  Everything appears to be well today.  If you have interested in smoking cessation at any point, please let Thomas [name] know.  We can help you with nicotine cravings so that we can get you off the cigarettes.  You can also set up an appointment in the future if you are interested in starting antihistamines for her allergies, otherwise you can take over the counter Zyrtec daily.  Please call the clinic at [phone] if your symptoms worsen or you have any concerns. It was our pleasure to serve you.  Durward Parcelavid Yasamin Karel, DO [facility], PGY-3

## 2019-03-09 ENCOUNTER — Telehealth: Payer: Self-pay | Admitting: Family Medicine

## 2019-03-09 NOTE — Telephone Encounter (Signed)
Reviewed form and placed in PCP's box for completion.  .Michelle R Simpson, CMA  

## 2019-03-09 NOTE — Telephone Encounter (Signed)
Pt's caregiver from his group home came into office with the physical form to be completed by PCP that the [place] needs before his physical appointment on 09-22, the form is needed by 09-10 is possible. Form placed in red team folder, and pt's 03-25-18.

## 2019-03-11 ENCOUNTER — Encounter: Payer: Self-pay | Admitting: Family Medicine

## 2019-03-11 NOTE — Telephone Encounter (Signed)
VM left on Luvenia Starch VM informing of form ready for pick up. Copy made for batch scanning.

## 2019-03-11 NOTE — Telephone Encounter (Signed)
Form completed and placed in box at front office

## 2019-03-29 ENCOUNTER — Ambulatory Visit (INDEPENDENT_AMBULATORY_CARE_PROVIDER_SITE_OTHER): Payer: Medicaid Other | Admitting: Family Medicine

## 2019-03-29 ENCOUNTER — Encounter: Payer: Self-pay | Admitting: Family Medicine

## 2019-03-29 ENCOUNTER — Other Ambulatory Visit: Payer: Self-pay

## 2019-03-29 VITALS — BP 132/92 | HR 79 | Wt 143.4 lb

## 2019-03-29 DIAGNOSIS — Z0000 Encounter for general adult medical examination without abnormal findings: Secondary | ICD-10-CM

## 2019-03-29 DIAGNOSIS — Z23 Encounter for immunization: Secondary | ICD-10-CM

## 2019-03-29 DIAGNOSIS — F250 Schizoaffective disorder, bipolar type: Secondary | ICD-10-CM | POA: Diagnosis not present

## 2019-03-29 DIAGNOSIS — F17200 Nicotine dependence, unspecified, uncomplicated: Secondary | ICD-10-CM

## 2019-03-29 LAB — POCT URINALYSIS DIP (MANUAL ENTRY)
Bilirubin, UA: NEGATIVE
Blood, UA: NEGATIVE
Glucose, UA: NEGATIVE mg/dL
Ketones, POC UA: NEGATIVE mg/dL
Leukocytes, UA: NEGATIVE
Nitrite, UA: NEGATIVE
Protein Ur, POC: NEGATIVE mg/dL
Spec Grav, UA: <=1.005 — AB (ref 1.010–1.025)
Urobilinogen, UA: 0.2 E.U./dL
pH, UA: 7 (ref 5.0–8.0)

## 2019-03-29 NOTE — Assessment & Plan Note (Addendum)
Discussed cessation given health risks and increased risk of complications with COVID. Patient is not interested in cessation at this time. He is willing to consider decreasing by 1 cigarette but would not commit during encounter. He is without cough, chest pain, shortness of breath, unexplained weight loss. - plan to continue to encourage smoking cessation.

## 2019-03-29 NOTE — Assessment & Plan Note (Signed)
Chronic and well controlled on current medications. Followed closely with [facility].  - CBC, CMP, lipid panel, thyroid panel, Vit. D level, and UA obtain as part of medication monitoring.

## 2019-03-29 NOTE — Progress Notes (Signed)
Subjective:   Patient ID: Thomas [name]    DOB: 22-Feb-1986, 33 y.o. male   MRN: [id number]  Thomas [name] is a 33 y.o. male with a history of schizoaffective disorder, mental retardation, and tobacco use disorder here for annual physical exam. Manager of group home is present for exam.  CC: Patient presents today for his annual physical which is required by the group home where he resides. Denies any concerns.   Tobacco Use Disorder: Patient smokes 8 cigarettes per day x 14 years. He notes he is not interested in smoking cessation today. He uses cigarettes as a way to relax.    Schizoaffective Disorder:  Currently on Haldol and Zyprexa. Managed by [facility]. He attends every 6 months ago, last visit was Telemedicine on 11/25/18. Tolerating meds well.  Psych requests I obtain yearly labs today as they are required. They request: CBC with diff, CMP, lipid panel, thyroid panel, UA, and Vitamin D - 1,25-OH.  ROS: Patient reports no vision/ hearing changes,anorexia, weight change, fever ,adenopathy, persistant / recurrent hoarseness, swallowing issues, chest pain, edema,persistant / recurrent cough, hemoptysis, dyspnea(rest, exertional, paroxysmal nocturnal), gastrointestinal  bleeding (melena, rectal bleeding), abdominal pain, excessive heart burn, GU symptoms(dysuria, hematuria, pyuria, voiding/incontinence  Issues) syncope, focal weakness, severe memory loss, concerning skin lesions, depression, anxiety, abnormal bruising/bleeding, major joint swelling.     Health Maintenance:  Due for flu vaccine.  Review of Systems:  Per HPI.   [place], medications and smoking status reviewed.  Objective:   BP (!) 132/92   Pulse 79   Wt 143 lb 6.4 oz (65 kg)   SpO2 99%   BMI 24.61 kg/m  Vitals and nursing note reviewed.  General: well nourished, well developed, in no acute distress with non-toxic appearance, sitting comfortably in exam chair with group home manager at side  HEENT:  normocephalic, atraumatic, moist mucous membranes, oropharynx clear without erythema or exudate, external ear canals clear without erythema, TM's WNL bilaterally Neck: supple, normal ROM CV: regular rate and rhythm without murmurs, rubs, or gallops, no lower extremity edema, 2+ radial pulses bilaterally Lungs: clear to auscultation bilaterally with normal work of breathing Abdomen: soft, non-tender, non-distended, normoactive bowel sounds Skin: warm, dry Extremities: warm and well perfused Neuro: Alert and oriented, speech normal Psych: Alert, communicative  and cooperative with normal attention span and concentration. No apparent delusions, illusions, hallucinations. Denies SI/HI.  Assessment & Plan:  Thomas [name] is a 33 y.o. Male presenting for annual physical exam.  Has no complaints today.  Annual Exam: No acute concerns or complaints today. Patient to get flu shot and lab work today.   Schizoaffective disorder, bipolar type Chronic and well controlled on current medications. Followed closely with [facility].  - CBC, CMP, lipid panel, thyroid panel, Vit. D level, and UA obtain as part of medication monitoring.   Tobacco use disorder Discussed cessation given health risks and increased risk of complications with COVID. Patient is not interested in cessation at this time. He is willing to consider decreasing by 1 cigarette but would not commit during encounter. He is without cough, chest pain, shortness of breath, unexplained weight loss. - plan to continue to encourage smoking cessation.   Health Maintenance: - Flu shot given today  Orders Placed This Encounter  Procedures  . CBC with Differential  . Comprehensive metabolic panel  . Lipid Panel  . TSH  . T4, Free  . T3, Free  . Vitamin D 1,25 dihydroxy  . POCT UA -  Microscopic Only   No orders of the defined types were placed in this encounter.   Orpah Cobb, DO PGY-2, [facility]  03/29/2019 2:02 PM

## 2019-04-03 ENCOUNTER — Encounter: Payer: Self-pay | Admitting: Family Medicine

## 2019-04-03 LAB — CBC WITH DIFFERENTIAL/PLATELET
Basophils Absolute: 0 10*3/uL (ref 0.0–0.2)
Basos: 0 %
EOS (ABSOLUTE): 0.1 10*3/uL (ref 0.0–0.4)
Eos: 1 %
Hematocrit: 42.7 % (ref 37.5–51.0)
Hemoglobin: 14.3 g/dL (ref 13.0–17.7)
Immature Grans (Abs): 0 10*3/uL (ref 0.0–0.1)
Immature Granulocytes: 0 %
Lymphocytes Absolute: 2.8 10*3/uL (ref 0.7–3.1)
Lymphs: 28 %
MCH: 28.7 pg (ref 26.6–33.0)
MCHC: 33.5 g/dL (ref 31.5–35.7)
MCV: 86 fL (ref 79–97)
Monocytes Absolute: 0.5 10*3/uL (ref 0.1–0.9)
Monocytes: 5 %
Neutrophils Absolute: 6.5 10*3/uL (ref 1.4–7.0)
Neutrophils: 66 %
Platelets: 264 10*3/uL (ref 150–450)
RBC: 4.99 x10E6/uL (ref 4.14–5.80)
RDW: 13.3 % (ref 11.6–15.4)
WBC: 9.9 10*3/uL (ref 3.4–10.8)

## 2019-04-03 LAB — COMPREHENSIVE METABOLIC PANEL
ALT: 34 IU/L (ref 0–44)
AST: 28 IU/L (ref 0–40)
Albumin/Globulin Ratio: 1.5 (ref 1.2–2.2)
Albumin: 4.6 g/dL (ref 4.0–5.0)
Alkaline Phosphatase: 132 IU/L — ABNORMAL HIGH (ref 39–117)
BUN/Creatinine Ratio: 9 (ref 9–20)
BUN: 7 mg/dL (ref 6–20)
Bilirubin Total: 0.3 mg/dL (ref 0.0–1.2)
CO2: 24 mmol/L (ref 20–29)
Calcium: 9.5 mg/dL (ref 8.7–10.2)
Chloride: 103 mmol/L (ref 96–106)
Creatinine, Ser: 0.78 mg/dL (ref 0.76–1.27)
GFR calc Af Amer: 139 mL/min/{1.73_m2} (ref >59)
GFR calc non Af Amer: 120 mL/min/{1.73_m2} (ref >59)
Globulin, Total: 3 g/dL (ref 1.5–4.5)
Glucose: 85 mg/dL (ref 65–99)
Potassium: 4.3 mmol/L (ref 3.5–5.2)
Sodium: 142 mmol/L (ref 134–144)
Total Protein: 7.6 g/dL (ref 6.0–8.5)

## 2019-04-03 LAB — LIPID PANEL
Chol/HDL Ratio: 5 ratio (ref 0.0–5.0)
Cholesterol, Total: 203 mg/dL — ABNORMAL HIGH (ref 100–199)
HDL: 41 mg/dL (ref >39)
LDL Chol Calc (NIH): 151 mg/dL — ABNORMAL HIGH (ref 0–99)
Triglycerides: 60 mg/dL (ref 0–149)
VLDL Cholesterol Cal: 11 mg/dL (ref 5–40)

## 2019-04-03 LAB — VITAMIN D 1,25 DIHYDROXY
Vitamin D 1, 25 (OH)2 Total: 68 pg/mL — ABNORMAL HIGH
Vitamin D2 1, 25 (OH)2: <10 pg/mL
Vitamin D3 1, 25 (OH)2: 68 pg/mL

## 2019-04-03 LAB — T4, FREE: Free T4: 1.5 ng/dL (ref 0.82–1.77)

## 2019-04-03 LAB — T3, FREE: T3, Free: 3.7 pg/mL (ref 2.0–4.4)

## 2019-04-03 LAB — TSH: TSH: 0.772 u[IU]/mL (ref 0.450–4.500)

## 2019-06-13 ENCOUNTER — Ambulatory Visit (HOSPITAL_COMMUNITY)
Admission: EM | Admit: 2019-06-13 | Discharge: 2019-06-13 | Disposition: A | Discharge status: Home / Self Care | Payer: Medicaid Other | Attending: Family Medicine | Admitting: Family Medicine

## 2019-06-13 ENCOUNTER — Ambulatory Visit (INDEPENDENT_AMBULATORY_CARE_PROVIDER_SITE_OTHER): Payer: Medicaid Other

## 2019-06-13 ENCOUNTER — Other Ambulatory Visit: Payer: Self-pay

## 2019-06-13 DIAGNOSIS — S62346A Nondisplaced fracture of base of fifth metacarpal bone, right hand, initial encounter for closed fracture: Secondary | ICD-10-CM

## 2019-06-13 NOTE — Progress Notes (Signed)
Orthopedic Tech Progress Note Patient Details:  Thomas [name] 1986-01-12 [id number]  Ortho Devices Type of Ortho Device: Ace wrap, Ulna gutter splint Ortho Device/Splint Location: right Ortho Device/Splint Interventions: Application   Post Interventions Patient Tolerated: Well Instructions Provided: Care of device   [name] 06/13/2019, 12:21 PM
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
## 2019-06-13 NOTE — ED Provider Notes (Signed)
[place]   [id number] 06/13/19 Arrival Time: 0930  ASSESSMENT & PLAN:  1. Nondisplaced fracture of base of fifth metacarpal bone, right hand, initial encounter for closed fracture     I have personally viewed the imaging studies ordered this visit. Base of R fifth metacarpal fracture.  Ulnar gutter splint applied by orthopaedic tech.    Discharge Instructions     If not allergic, you may use over the counter ibuprofen or acetaminophen as needed.       Follow-up Information    Schedule an appointment as soon as possible for a visit  with [facility].   Contact information: [address] [place] [phone]          Reviewed expectations re: course of current medical issues. Questions answered. Outlined signs and symptoms indicating need for more acute intervention. Patient verbalized understanding. After Visit Summary given.  SUBJECTIVE: History from: patient. Thomas [name] is a 33 y.o. male who reports persistent moderate pain of his right hand; described as aching; without radiation. Onset: abrupt. First noted: yesterday. Injury/trama: reports punching a wall with immediate pain. Symptoms have progressed to a point and plateaued since beginning. Aggravating factors: certain movements. Alleviating factors: rest. Associated symptoms: none reported. Extremity sensation changes or weakness: none. Self treatment: has not tried OTC therapies.  History of similar: no.  No past surgical history on file.   ROS: As per HPI. All other systems negative.    OBJECTIVE:  Vitals:   06/13/19 1058  BP: (!) 152/110  Pulse: 85  Resp: 16  Temp: 98.6 F (37 C)  TempSrc: Oral  SpO2: 100%    General appearance: alert; no distress HEENT: [place]; AT Neck: supple with FROM CV: RRR Resp: unlabored respirations Extremities: . RUE: warm with well perfused appearance; well localized moderate tenderness  over right hand, specifically over proximal fifth metacarpal; without gross deformities; swelling: moderate; bruising: none; wrist ROM: normal without reported pain CV: brisk extremity capillary refill of RUE; 2+ radial pulse of RUE. Skin: warm and dry; no visible rashes Neurologic: gait normal; normal reflexes of RUE; normal sensation of RUE; normal strength of RUE Psychological: alert and cooperative; normal mood and affect  Imaging: Dg Hand Complete Right  Result Date: 06/13/2019 CLINICAL DATA:  Right hand pain for 2 days after punching a wall. Initial encounter. EXAM: RIGHT HAND - COMPLETE 3+ VIEW COMPARISON:  None. FINDINGS: The patient has an acute fracture through the base of the fifth metacarpal. The fracture extends from the carpometacarpal joint into the metaphysis. There is approximately 0.2 cm at the articular surface of the carpometacarpal joint. No other acute fracture is identified. Remote fracture of the neck of the fifth metacarpal is noted. Soft tissue swelling is present about the hand. IMPRESSION: Acute fracture base of the right fifth metacarpal with impaction of the articular surface. Electronically Signed   By: Inge Rise M.D.   On: 06/13/2019 11:23      No Known Allergies  Past Medical History:  Diagnosis Date  . Acne   . Impulse control disorder   . IMPULSE CONTROL DISORDER 08/29/2009   Qualifier: Diagnosis of  By: Georgina Snell MD, Ellard Artis    . Mental retardation, mild (I.Q. 50-70)   . MENTAL RETARDATION, MODERATE 08/29/2009   Qualifier: Diagnosis of  By: Georgina Snell MD, Ellard Artis    . Schizo-affective psychosis ([place])   . Ulcer of foot ([place])    Social History   Socioeconomic History  .  Marital status: Single    Spouse name: Not on file  . Number of children: Not on file  . Years of education: Not on file  . Highest education level: Not on file  Occupational History  . Not on file  Social Needs  . Financial resource strain: Not on file  . Food insecurity    Worry: Not  on file    Inability: Not on file  . Transportation needs    Medical: Not on file    Non-medical: Not on file  Tobacco Use  . Smoking status: Current Every Day Smoker    Packs/day: 0.50    Years: 12.00    Pack years: 6.00    Types: Cigarettes  . Smokeless tobacco: Never Used  Substance and Sexual Activity  . Alcohol use: No  . Drug use: No  . Sexual activity: Never  Lifestyle  . Physical activity    Days per week: Not on file    Minutes per session: Not on file  . Stress: Not on file  Relationships  . Social Musician on phone: Not on file    Gets together: Not on file    Attends religious service: Not on file    Active member of club or organization: Not on file    Attends meetings of clubs or organizations: Not on file    Relationship status: Not on file  Other Topics Concern  . Not on file  Social History Narrative   Is a ward of the state.      Is living at [facility] in [place] since 03/2010. Contact person is April Evans [phone]   Guardian is Kristin Bruins, she is through [place] [employer] [place].  [phone]   Hx of Alcohol, MJ, and Tobacco abuse.     Currently smokes 7 cigs daily, but hx of 3 ppd.     Current Smoker         FH: Question of HTN  No past surgical history on file.    Mardella Layman, MD 06/13/19 1149

## 2019-06-13 NOTE — Discharge Instructions (Signed)
If not allergic, you may use over the counter ibuprofen or acetaminophen as needed. ° °

## 2019-06-16 ENCOUNTER — Encounter (HOSPITAL_COMMUNITY): Payer: Self-pay | Admitting: Orthopedic Surgery

## 2019-06-16 ENCOUNTER — Other Ambulatory Visit (HOSPITAL_COMMUNITY): Admission: RE | Admit: 2019-06-16 | Payer: Medicaid Other | Source: Ambulatory Visit

## 2019-06-16 ENCOUNTER — Other Ambulatory Visit: Payer: Self-pay | Admitting: Orthopedic Surgery

## 2019-06-16 NOTE — Progress Notes (Signed)
Mr. Thomas [name] lives in a group home- [facility]. Patient has a legal guardian - Antony Blackbird, I called Ms Nicki Reaper and asked that guardianship papers be sent with patient to the hospital or faxed to PAT. I informed Ms Nicki Reaper what the consent is for and she gave permission verbally over the phone to myself and Jenne Pane, RN.
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
## 2019-06-16 NOTE — Progress Notes (Signed)
Thomas [name] lives at  [facility]. I spoke to Thomas [name] from the group home to obtain the information [place] has moderate Mental retardation, (Educational leve 8 grade), Impulse Control Disorder and Schizophrenia. Thomas Strutz had been tested for Covid and has been negative, he will be tested upon arrival 06/20/2019, because he lives in a group home.  Thomas [name]' legal guardian is Thomas [name].  I spoke to Thomas [name] and was able to obtain consent with another as the 2nd witness.  I asked Thomas [name] to send me the Legal Guardianship papers, she said that the group home should have them, if not she would fax them to the group home. I asked her to fax to the hospital also, just incase they are left at the group home. I have not received the papers and I spoke with Thomas [name] and he has not received the papers. I will message Thomas [name] [facility], AD to have someone to follow up on Friday.   I informed Thomas [name] that patient is to not eat after midnight, but may drink clear liquids until 0430.  Thomas [name] said that Thomas [name] drink water all the time, that would be his clear liquid.

## 2019-06-19 ENCOUNTER — Encounter (HOSPITAL_COMMUNITY): Payer: Self-pay | Admitting: Anesthesiology

## 2019-06-19 NOTE — Anesthesia Preprocedure Evaluation (Deleted)
Anesthesia Evaluation    Reviewed: Allergy & Precautions, Patient's Chart, lab work & pertinent test results  History of Anesthesia Complications Negative for: history of anesthetic complications  Airway        Dental   Pulmonary Current Smoker,           Cardiovascular negative cardio ROS       Neuro/Psych Anxiety Bipolar Disorder Schizophrenia Intellectual disabilitynegative neurological ROS     GI/Hepatic negative GI ROS, Neg liver ROS,   Endo/Other  negative endocrine ROS  Renal/GU negative Renal ROS  negative genitourinary   Musculoskeletal negative musculoskeletal ROS (+)   Abdominal   Peds  Hematology negative hematology ROS (+)   Anesthesia Other Findings Day of surgery medications reviewed with patient.  Reproductive/Obstetrics negative OB ROS                             Anesthesia Physical Anesthesia Plan  ASA: I  Anesthesia Plan: Regional and MAC   Post-op Pain Management:    Induction:   PONV Risk Score and Plan: 0 and Treatment may vary due to age or medical condition, Ondansetron, Propofol infusion and Midazolam  Airway Management Planned: Natural Airway and Simple Face Mask  Additional Equipment: None  Intra-op Plan:   Post-operative Plan:   Informed Consent:   Plan Discussed with:   Anesthesia Plan Comments:         Anesthesia Quick Evaluation

## 2019-06-20 ENCOUNTER — Ambulatory Visit (HOSPITAL_COMMUNITY): Admission: RE | Admit: 2019-06-20 | Payer: Medicaid Other | Source: Home / Self Care | Admitting: Orthopedic Surgery

## 2019-06-20 HISTORY — DX: Anxiety disorder, unspecified: F41.9

## 2019-06-20 SURGERY — OPEN REDUCTION INTERNAL FIXATION (ORIF) METACARPAL
Anesthesia: Monitor Anesthesia Care | Site: Finger | Laterality: Right

## 2019-07-19 ENCOUNTER — Ambulatory Visit (INDEPENDENT_AMBULATORY_CARE_PROVIDER_SITE_OTHER): Payer: Medicaid Other | Admitting: Family Medicine

## 2019-07-19 ENCOUNTER — Other Ambulatory Visit: Payer: Self-pay

## 2019-07-19 DIAGNOSIS — L732 Hidradenitis suppurativa: Secondary | ICD-10-CM

## 2019-07-19 MED ORDER — DOXYCYCLINE HYCLATE 100 MG PO TABS: 100.0000 mg | ORAL_TABLET | Freq: Two times a day (BID) | ORAL | 0 refills | Status: DC

## 2019-07-19 MED ORDER — DOXYCYCLINE HYCLATE 100 MG PO TABS: 100.0000 mg | ORAL_TABLET | Freq: Two times a day (BID) | ORAL | 0 refills | Status: AC

## 2019-07-19 NOTE — Progress Notes (Cosign Needed)
   Subjective:    Patient ID: Thomas [name], male    DOB: 1985/08/15, 34 y.o.   MRN: [id number]   CC: Thomas [name] is a 34 year old male who presents today with right buttock pain.  Was accompanied by staff member from group home  HPI:  Buttock abscess Patient has history of recurrent abscesses on his buttock area requiring I&D as an outpatient.  Has not required admission to hospital or referral to surgery. Over the last week noticed swelling and pain with the medial aspect of the right buttock.  Abscess approximately 7 x 8 cm. Has been unable to sit or sleep on the area and has been very uncomfortable.  Abscess has been draining purulent and bloody fluid. Has been feeling unwell with this but denies fevers.   Smoking status reviewed       ROS: pertinent noted in the HPI    Past medical history, surgical, family, and social history reviewed and updated in the EMR as appropriate. Reviewed problem list.   Objective:  BP 120/70   Pulse (!) 117   Temp 97.9 F (36.6 C)   Wt 128 lb (58.1 kg)   SpO2 99%   BMI 22.67 kg/m   Vitals and nursing note reviewed  General: NAD, pleasant, able to participate in exam Cardiac: Warm and well-perfused Respiratory: No respiratory distress Extremities: no edema or cyanosis. Skin: warm and dry, no rashes noted Neuro: alert, no obvious focal deficits Psych: Normal affect and mood  Procedure note Procedure was carried out in a sterile field with attending Zachery Dauer present.  I cleaned the abscess and skin surrounding the abscess with iodine and applied local anesthetic spray to the area. I made 3.5 hich incision in the abscess which then drained purulent and bloody fluid. I packed the wound with gauze.  Patient tolerated procedure well.  Assessment & Plan:    Suppurative hidradenitis Suspicion for hidradenitis suppurativa given recurrent nature of abscess and location.  Prescribed 1 month course of doxycycline 100 mg twice daily.  Recommended that  patient keeps packing in until next week when he will follow-up with me.  Provided safety netting: If he develops fevers, pain worsens or abscess grows in size to go to the ER.  Staff member who accompanied him was also given this information and understood.  Recommended that the care home give him Tylenol and ibuprofen if the area becomes tender and recommended to keep the area as dry as possible.    Towanda Octave, MD  [facility] PGY-1

## 2019-07-19 NOTE — Patient Instructions (Addendum)
Thomas [name],  It was great to meet you today. Today we drained the abscess on your left buttock which was drained successfully.  We have packed it with some gauze which you should keep in until next week. I have started you on a course of doxycycline you will need to take this for 1 month.  Please come and see me next week where I will remove the packing gauze for you and examine whether the abscess has healed or not.   If in the meantime you develop any fevers, the abscess becomes larger and more painful please go to the ER immediately.  Best wishes,   Dr. Allena Katz

## 2019-07-19 NOTE — Assessment & Plan Note (Signed)
Suspicion for hidradenitis suppurativa given recurrent nature of abscess and location.  Prescribed 1 month course of doxycycline 100 mg twice daily.  Recommended that patient keeps packing in until next week when he will follow-up with me.  Provided safety netting: If he develops fevers, pain worsens or abscess grows in size to go to the ER.  Staff member who accompanied him was also given this information and understood.  Recommended that the care home give him Tylenol and ibuprofen if the area becomes tender and recommended to keep the area as dry as possible.

## 2019-07-20 ENCOUNTER — Telehealth: Payer: Self-pay | Admitting: Family Medicine

## 2019-07-20 NOTE — Telephone Encounter (Signed)
Contacted pt and informed caregiver of below and appointment scheduled on Friday 07/22/19 with PCP to have this done. Will leave appointment on Monday just incase they miss Friday.  While on the phone the caregiver stated that the psych doctor said that they saw pts Vitamin D and that it was low and that he should probably be on something for this.  Instructed pt caregiver that I would send a message to PCP but to ask at visit in case PCP has not gotten back to [place] or them prior to the visit.Eliese Kerwood Zimmerman Rumple, CMA

## 2019-07-20 NOTE — Telephone Encounter (Signed)
White team pls contact pt to come in on 1/14 or 1/14 to remove packing-Per Dr Sheffield Slider. Has been scheduled for visit on 1/18 (Please cancel) thank you!

## 2019-07-20 NOTE — Telephone Encounter (Signed)
The wrong Vitamin D level was obtained at his last visit. Will obtain correct 25-OH level at follow up. If low will supplement.

## 2019-07-22 ENCOUNTER — Telehealth: Payer: Self-pay

## 2019-07-22 ENCOUNTER — Encounter: Payer: Self-pay | Admitting: Family Medicine

## 2019-07-22 ENCOUNTER — Other Ambulatory Visit: Payer: Self-pay

## 2019-07-22 ENCOUNTER — Ambulatory Visit (INDEPENDENT_AMBULATORY_CARE_PROVIDER_SITE_OTHER): Payer: Medicaid Other | Admitting: Family Medicine

## 2019-07-22 VITALS — BP 118/84 | HR 100

## 2019-07-22 DIAGNOSIS — L732 Hidradenitis suppurativa: Secondary | ICD-10-CM | POA: Diagnosis not present

## 2019-07-22 DIAGNOSIS — Z8639 Personal history of other endocrine, nutritional and metabolic disease: Secondary | ICD-10-CM

## 2019-07-22 NOTE — Telephone Encounter (Signed)
Called pt to ask COVID screening questions. Sunday Spillers, CMA

## 2019-07-22 NOTE — Progress Notes (Signed)
   Subjective:   Patient ID: Thomas [name]    DOB: Oct 14, 1985, 34 y.o. male   MRN: [id number]  Thomas [name] is a 34 y.o. male here for follow up of wound.  Buttock Abscess: Patient seen on 1/12 for abscess on buttock/gluteal cleft. Wound was lanced and packed. He was treated with 1 month course of Doxycycline. He notes the pain has improved. Denies any fever or chills. Packing fell out on its own yesterday morning. Notes very little drainage of yellow and blood material.   Review of Systems:  Per HPI.   PMFSH, medications and smoking status reviewed.  Objective:   BP 118/84   Pulse 100   SpO2 98%  Vitals and nursing note reviewed.  General: pleasant young male, sitting comfortably in exam chair without apparent discomfort, well nourished, well developed, in no acute distress with non-toxic appearance Resp: Breathing comfortably on room air, speaking in full sentences Skin: warm, dry, large semilunar shaped overlying ulcer with underlying cavity, no significant drainage with veyr little serosanguinous drainage, no overlying erythema or warmth, several other small cysts surrounding gluteal cleft with some scarring Extremities: warm and well perfused MSK:  gait normal      Assessment & Plan:   Suppurative hidradenitis Improving. Minimal drainage from abscess cavity. Packed self removed yesterday. No overlying erythema, warmth, or malodor. Currently taking Doxycycline 100mg  QD x 1 month. Discussed surgical referral for excision given his history of recurrent cysts and number of current cysts present. Patient and caregiver opted to wait on referral at this time. Consider going forward if continues to recur. Patient may benefit from long term antibiotic for prophylaxis (consider Doxy 50-100mg  QD). Will address once acute abscess is healed. Recommended daily wound care with mild soap and water. Apply Vaseline and bandage 2-3x/day. Monitor for worsening symptoms and return to care if they  occur. Follow up in 2 weeks for wound recheck, sooner if needed.  History of vitamin D deficiency History of Vitamin D deficiency. Wrong lab order obtained at last physical exam. Will obtain level. If low, will recommended supplementation.   Orders Placed This Encounter  Procedures  . Vitamin D, 25-hydroxy    , DO PGY-2, [facility] 07/22/2019 1:27 PM

## 2019-07-22 NOTE — Patient Instructions (Signed)
Please continue the Doxycycline as prescribed. Clean the area daily with gentle soap and water Apply vaseline and bandage 2-3 times a day. Please return if worsening pain, discharge, fevers chills. Follow up in 2 weeks for wound check. Can consider a surgical referral for removal if you desire.   Take care, Dr. Mauri Reading

## 2019-07-22 NOTE — Assessment & Plan Note (Signed)
Improving. Minimal drainage from abscess cavity. Packed self removed yesterday. No overlying erythema, warmth, or malodor. Currently taking Doxycycline 100mg  QD x 1 month. Discussed surgical referral for excision given his history of recurrent cysts and number of current cysts present. Patient and caregiver opted to wait on referral at this time. Consider going forward if continues to recur. Patient may benefit from long term antibiotic for prophylaxis (consider Doxy 50-100mg  QD). Will address once acute abscess is healed. Recommended daily wound care with mild soap and water. Apply Vaseline and bandage 2-3x/day. Monitor for worsening symptoms and return to care if they occur. Follow up in 2 weeks for wound recheck, sooner if needed.

## 2019-07-22 NOTE — Assessment & Plan Note (Signed)
History of Vitamin D deficiency. Wrong lab order obtained at last physical exam. Will obtain level. If low, will recommended supplementation.

## 2019-07-23 LAB — VITAMIN D 25 HYDROXY (VIT D DEFICIENCY, FRACTURES): Vit D, 25-Hydroxy: 16.2 ng/mL — ABNORMAL LOW (ref 30.0–100.0)

## 2019-07-25 ENCOUNTER — Ambulatory Visit: Payer: Medicaid Other | Admitting: Family Medicine

## 2019-07-27 ENCOUNTER — Telehealth: Payer: Self-pay | Admitting: Family Medicine

## 2019-07-27 DIAGNOSIS — E559 Vitamin D deficiency, unspecified: Secondary | ICD-10-CM

## 2019-07-27 MED ORDER — VITAMIN D 25 MCG (1000 UNIT) PO TABS: 1000.0000 [IU] | ORAL_TABLET | Freq: Every day | ORAL | 11 refills | Status: DC

## 2019-07-27 NOTE — Telephone Encounter (Signed)
Spoke to caregiver Barrington Ellison. Informed him of low vitamin D level. Recommend 1000IU of Vitamin D daily. RX sent to pharmacy as requested.

## 2019-08-05 ENCOUNTER — Ambulatory Visit: Payer: Medicaid Other | Admitting: Family Medicine

## 2019-08-19 ENCOUNTER — Ambulatory Visit: Payer: Medicaid Other | Admitting: Family Medicine

## 2019-10-21 ENCOUNTER — Other Ambulatory Visit: Payer: Self-pay

## 2019-10-21 ENCOUNTER — Ambulatory Visit (INDEPENDENT_AMBULATORY_CARE_PROVIDER_SITE_OTHER): Payer: Medicaid Other | Admitting: Family Medicine

## 2019-10-21 VITALS — BP 118/88 | HR 113 | Ht 64.0 in | Wt 128.2 lb

## 2019-10-21 DIAGNOSIS — L732 Hidradenitis suppurativa: Secondary | ICD-10-CM | POA: Diagnosis present

## 2019-10-21 MED ORDER — DOXYCYCLINE HYCLATE 100 MG PO TABS: 100.0000 mg | ORAL_TABLET | Freq: Two times a day (BID) | ORAL | 0 refills | Status: DC

## 2019-10-21 NOTE — Patient Instructions (Addendum)
Today we talked about your skin condition called hidradenitis suppurativa, I am putting in 10 days worth of doxycycline for you as an antibiotic and an urgent referral to a general surgeon to discuss the potential of surgical intervention.  If you start getting any fevers or if this get significantly worse please reach out to [place] immediately   Hidradenitis Suppurativa Hidradenitis suppurativa is a long-term (chronic) skin disease. It is similar to a severe form of acne, but it affects areas of the body where acne would be unusual, especially areas of the body where skin rubs against skin and becomes moist. These include:  Underarms.  Groin.  Genital area.  Buttocks.  Upper thighs.  Breasts. Hidradenitis suppurativa may start out as small lumps or pimples caused by blocked sweat glands or hair follicles. Pimples may develop into deep sores that break open (rupture) and drain pus. Over time, affected areas of skin may thicken and become scarred. This condition is rare and does not spread from person to person (non-contagious). What are the causes? The exact cause of this condition is not known. It may be related to:  Male and male hormones.  An overactive disease-fighting system (immune system). The immune system may over-react to blocked hair follicles or sweat glands and cause swelling and pus-filled sores. What increases the risk? You are more likely to develop this condition if you:  Are male.  Are 41-55 years old.  Have a family history of hidradenitis suppurativa.  Have a personal history of acne.  Are overweight.  Smoke.  Take the medicine lithium. What are the signs or symptoms? The first symptoms are usually painful bumps in the skin, similar to pimples. The condition may get worse over time (progress), or it may only cause mild symptoms. If the disease progresses, symptoms may include:  Skin bumps getting bigger and growing deeper into the skin.  Bumps  rupturing and draining pus.  Itchy, infected skin.  Skin getting thicker and scarred.  Tunnels under the skin (fistulas) where pus drains from a bump.  Pain during daily activities, such as pain during walking if your groin area is affected.  Emotional problems, such as stress or depression. This condition may affect your appearance and your ability or willingness to wear certain clothes or do certain activities. How is this diagnosed? This condition is diagnosed by a health care provider who specializes in skin diseases (dermatologist). You may be diagnosed based on:  Your symptoms and medical history.  A physical exam.  Testing a pus sample for infection.  Blood tests. How is this treated? Your treatment will depend on how severe your symptoms are. The same treatment will not work for everybody with this condition. You may need to try several treatments to find what works best for you. Treatment may include:  Cleaning and bandaging (dressing) your wounds as needed.  Lifestyle changes, such as new skin care routines.  Taking medicines, such as: ? Antibiotics. ? Acne medicines. ? Medicines to reduce the activity of the immune system. ? A diabetes medicine (metformin). ? Birth control pills, for women. ? Steroids to reduce swelling and pain.  Working with a mental health care provider, if you experience emotional distress due to this condition. If you have severe symptoms that do not get better with medicine, you may need surgery. Surgery may involve:  Using a laser to clear the skin and remove hair follicles.  Opening and draining deep sores.  Removing the areas of skin that are diseased  and scarred. Follow these instructions at home: Medicines   Take over-the-counter and prescription medicines only as told by your health care provider.  If you were prescribed an antibiotic medicine, take it as told by your health care provider. Do not stop taking the antibiotic even  if your condition improves. Skin care  If you have open wounds, cover them with a clean dressing as told by your health care provider. Keep wounds clean by washing them gently with soap and water when you bathe.  Do not shave the areas where you get hidradenitis suppurativa.  Do not wear deodorant.  Wear loose-fitting clothes.  Try to avoid getting overheated or sweaty. If you get sweaty or wet, change into clean, dry clothes as soon as you can.  To help relieve pain and itchiness, cover sore areas with a warm, clean washcloth (warm compress) for 5-10 minutes as often as needed.  If told by your health care provider, take a bleach bath twice a week: ? Fill your bathtub halfway with water. ? Pour in  cup of unscented household bleach. ? Soak in the tub for 5-10 minutes. ? Only soak from the neck down. Avoid water on your face and hair. ? Shower to rinse off the bleach from your skin. General instructions  Learn as much as you can about your disease so that you have an active role in your treatment. Work closely with your health care provider to find treatments that work for you.  If you are overweight, work with your health care provider to lose weight as recommended.  Do not use any products that contain nicotine or tobacco, such as cigarettes and e-cigarettes. If you need help quitting, ask your health care provider.  If you struggle with living with this condition, talk with your health care provider or work with a mental health care provider as recommended.  Keep all follow-up visits as told by your health care provider. This is important. Where to find more information  Hidradenitis [place].: [email or web address]-[email or web address]/ Contact a health care provider if you have:  A flare-up of hidradenitis suppurativa.  A fever or chills.  Trouble controlling your symptoms at home.  Trouble doing your daily activities because of your symptoms.  Trouble dealing  with emotional problems related to your condition. Summary  Hidradenitis suppurativa is a long-term (chronic) skin disease. It is similar to a severe form of acne, but it affects areas of the body where acne would be unusual.  The first symptoms are usually painful bumps in the skin, similar to pimples. The condition may get worse over time (progress), or it may only cause mild symptoms.  If you have open wounds, cover them with a clean dressing as told by your health care provider. Keep wounds clean by washing them gently with soap and water when you bathe.  Besides skin care, treatment may include medicines, laser treatment, and surgery. This information is not intended to replace advice given to you by your health care provider. Make sure you discuss any questions you have with your health care provider. Document Revised: 07/01/2017 Document Reviewed: 07/01/2017 Elsevier Patient Education  2020 [employer].

## 2019-10-24 NOTE — Assessment & Plan Note (Signed)
Expect this could be hidradenitis suppurativa versus recurring abscesses from some other undetermined source.  Has already failed a course of outpatient antibiotics, I do believe it is possible that surgery will be the most appropriate option for this is our attempts to I&D inside our office if failed.  Patient has no systemic symptoms like fever, will prescribe new course of doxy and urgent referral to surgery.  Continue superficial wound care  Emergency precautions discussed with caregiver

## 2019-10-24 NOTE — Progress Notes (Signed)
    SUBJECTIVE:   CHIEF COMPLAINT / HPI:   Recurring "boils "on bilateral buttocks.  He is already seen Dr. Mauri Reading for this before a few months ago but has not had any improvement, this is very uncomfortable for him.  And has been a long-term issue.  He is not very verbally descriptive and lives with a caregiver who is with him.  He often does not want the caregiver to look at the bill so the caregiver does not have a lot of information  PERTINENT  PMH / PSH:   OBJECTIVE:   BP 118/88   Pulse (!) 113   Ht 5\' 4"  (1.626 m)   Wt 128 lb 3.2 oz (58.2 kg)   SpO2 100%   BMI 22.01 kg/m   General: Alert and pleasant, conversational but with some cognitive deficit, caregiver is present Skin exam: Gluteal exam performed without caregiver present at patient's request but with my CMA present in the room the entire time.  Significant multifocal suppurative hidradenitis versus other abscess, some purulence is visible but none easily drainable.  There are multiple palpable accumulations below the surface of the skin.  Patient is hemostatic with no surrounding indication of cellulitis  ASSESSMENT/PLAN:   Suppurative hidradenitis Expect this could be hidradenitis suppurativa versus recurring abscesses from some other undetermined source.  Has already failed a course of outpatient antibiotics, I do believe it is possible that surgery will be the most appropriate option for this is our attempts to I&D inside our office if failed.  Patient has no systemic symptoms like fever, will prescribe new course of doxy and urgent referral to surgery.  Continue superficial wound care  Emergency precautions discussed with caregiver     , DO [facility]

## 2019-10-27 ENCOUNTER — Ambulatory Visit: Payer: Self-pay | Admitting: Surgery

## 2019-10-28 ENCOUNTER — Other Ambulatory Visit: Payer: Self-pay

## 2019-10-28 ENCOUNTER — Ambulatory Visit (INDEPENDENT_AMBULATORY_CARE_PROVIDER_SITE_OTHER): Payer: Medicaid Other | Admitting: Family Medicine

## 2019-10-28 VITALS — BP 110/70 | HR 98 | Wt 127.4 lb

## 2019-10-28 DIAGNOSIS — B353 Tinea pedis: Secondary | ICD-10-CM

## 2019-10-28 DIAGNOSIS — L732 Hidradenitis suppurativa: Secondary | ICD-10-CM

## 2019-10-28 DIAGNOSIS — L03116 Cellulitis of left lower limb: Secondary | ICD-10-CM

## 2019-10-28 MED ORDER — CEPHALEXIN 500 MG PO CAPS: 500.0000 mg | ORAL_CAPSULE | Freq: Four times a day (QID) | ORAL | 0 refills | Status: DC

## 2019-10-28 MED ORDER — DOXYCYCLINE HYCLATE 100 MG PO TABS: 100.0000 mg | ORAL_TABLET | Freq: Two times a day (BID) | ORAL | 0 refills | Status: AC

## 2019-10-28 MED ORDER — TERBINAFINE HCL 1 % EX CREA: 1.0000 "application " | TOPICAL_CREAM | Freq: Two times a day (BID) | CUTANEOUS | 0 refills | Status: DC

## 2019-10-28 NOTE — Patient Instructions (Signed)
It was great to see you!  Our plans for today:  - We are adding another antibiotic to your regimen and extending your doxycycline course. You should finish both of these medications on 11/04/19. - We sent in a medication to treat your fungal infection on your feet. - Keep your appointment with the surgeon. - You can take tylenol or ibuprofen for pain. - Come back to see [place] in one week. - If you develop fever, chills, nausea, vomiting, go to the emergency room.  Take care and seek immediate care sooner if you develop any concerns.   Dr. [facility]

## 2019-10-28 NOTE — Progress Notes (Signed)
    SUBJECTIVE:   CHIEF COMPLAINT: Cellulitis on ankle  HPI:   Endorses 2 week h/o redness and pain on L lateral ankle. Has progressed slowly. Denies any known inciting event or injury, animal scratches, wounds or cuts. Denies discharge. Denies fevers, chills, progression up leg. Previously seen last week for boil on buttocks thought to be secondary to suppurative hidradenitis given h/o recurrent boils.  Referred to general surgery and started on 10-day course of doxycycline. Has appt next week with surgeon. Denies missed doses of antibiotics. Is eating and drinking normally.  PERTINENT  PMH / PSH: Superlative hidradenitis, schizoaffective disorder bipolar type, tobacco use, history of vitamin D deficiency  OBJECTIVE:   BP 110/70   Pulse 98   Wt 127 lb 6 oz (57.8 kg)   SpO2 99%   BMI 21.86 kg/m   Gen: well appearing, in NAD Skin: ~4x5 cm area of induration and erythema to L lateral lower leg without overlying cuts or abrasions. No fluctuance appreciated. DP pulses appreciated. Tinea pedis noted incidentally between L 3rd through 5th toes.      ASSESSMENT/PLAN:   Cellulitis of leg Likely 2/2 non-MRSA skin flora, possibly strep given slow progression on PO doxycycline and no fluctuance appreciated.  No systemic symptoms, vitals wnl. Will add 7 day course of keflex to cover for strep and extend doxy course for empiric coverage. F/u next week. Emergency precautions reviewed.  Tinea pedis Noted incidentally on exam. Rx given for terbinafine cream.    Thomas Dense, DO [facility]

## 2019-10-29 DIAGNOSIS — R21 Rash and other nonspecific skin eruption: Secondary | ICD-10-CM | POA: Insufficient documentation

## 2019-10-29 NOTE — Assessment & Plan Note (Signed)
Noted incidentally on exam. Rx given for terbinafine cream.

## 2019-10-29 NOTE — Assessment & Plan Note (Signed)
Likely 2/2 non-MRSA skin flora, possibly strep given slow progression on PO doxycycline and no fluctuance appreciated.  No systemic symptoms, vitals wnl. Will add 7 day course of keflex to cover for strep and extend doxy course for empiric coverage. F/u next week. Emergency precautions reviewed.

## 2019-11-01 ENCOUNTER — Ambulatory Visit (INDEPENDENT_AMBULATORY_CARE_PROVIDER_SITE_OTHER): Payer: Medicaid Other | Admitting: Surgery

## 2019-11-01 ENCOUNTER — Other Ambulatory Visit: Payer: Self-pay

## 2019-11-01 DIAGNOSIS — L732 Hidradenitis suppurativa: Secondary | ICD-10-CM | POA: Diagnosis not present

## 2019-11-01 MED ORDER — CLINDAMYCIN PHOSPHATE 1 % EX LOTN: TOPICAL_LOTION | Freq: Two times a day (BID) | CUTANEOUS | 0 refills | Status: DC

## 2019-11-01 NOTE — Patient Instructions (Addendum)
Dr.Rodenberg discussed with patient that he would send over a prescription cream for the patient at today's visit. Referral was sent to [facility].  Hidradenitis Suppurativa Hidradenitis suppurativa is a long-term (chronic) skin disease. It is similar to a severe form of acne, but it affects areas of the body where acne would be unusual, especially areas of the body where skin rubs against skin and becomes moist. These include:  Underarms.  Groin.  Genital area.  Buttocks.  Upper thighs.  Breasts. Hidradenitis suppurativa may start out as small lumps or pimples caused by blocked sweat glands or hair follicles. Pimples may develop into deep sores that break open (rupture) and drain pus. Over time, affected areas of skin may thicken and become scarred. This condition is rare and does not spread from person to person (non-contagious). What are the causes? The exact cause of this condition is not known. It may be related to:  Male and male hormones.  An overactive disease-fighting system (immune system). The immune system may over-react to blocked hair follicles or sweat glands and cause swelling and pus-filled sores. What increases the risk? You are more likely to develop this condition if you:  Are male.  Are 82-30 years old.  Have a family history of hidradenitis suppurativa.  Have a personal history of acne.  Are overweight.  Smoke.  Take the medicine lithium. What are the signs or symptoms? The first symptoms are usually painful bumps in the skin, similar to pimples. The condition may get worse over time (progress), or it may only cause mild symptoms. If the disease progresses, symptoms may include:  Skin bumps getting bigger and growing deeper into the skin.  Bumps rupturing and draining pus.  Itchy, infected skin.  Skin getting thicker and scarred.  Tunnels under the skin (fistulas) where pus drains from a bump.  Pain during daily  activities, such as pain during walking if your groin area is affected.  Emotional problems, such as stress or depression. This condition may affect your appearance and your ability or willingness to wear certain clothes or do certain activities. How is this diagnosed? This condition is diagnosed by a health care provider who specializes in skin diseases (dermatologist). You may be diagnosed based on:  Your symptoms and medical history.  A physical exam.  Testing a pus sample for infection.  Blood tests. How is this treated? Your treatment will depend on how severe your symptoms are. The same treatment will not work for everybody with this condition. You may need to try several treatments to find what works best for you. Treatment may include:  Cleaning and bandaging (dressing) your wounds as needed.  Lifestyle changes, such as new skin care routines.  Taking medicines, such as: ? Antibiotics. ? Acne medicines. ? Medicines to reduce the activity of the immune system. ? A diabetes medicine (metformin). ? Birth control pills, for women. ? Steroids to reduce swelling and pain.  Working with a mental health care provider, if you experience emotional distress due to this condition. If you have severe symptoms that do not get better with medicine, you may need surgery. Surgery may involve:  Using a laser to clear the skin and remove hair follicles.  Opening and draining deep sores.  Removing the areas of skin that are diseased and scarred. Follow these instructions at home: Medicines   Take over-the-counter and prescription medicines only as told by your health care provider.  If you were prescribed an antibiotic medicine, take it as  told by your health care provider. Do not stop taking the antibiotic even if your condition improves. Skin care  If you have open wounds, cover them with a clean dressing as told by your health care provider. Keep wounds clean by washing them  gently with soap and water when you bathe.  Do not shave the areas where you get hidradenitis suppurativa.  Do not wear deodorant.  Wear loose-fitting clothes.  Try to avoid getting overheated or sweaty. If you get sweaty or wet, change into clean, dry clothes as soon as you can.  To help relieve pain and itchiness, cover sore areas with a warm, clean washcloth (warm compress) for 5-10 minutes as often as needed.  If told by your health care provider, take a bleach bath twice a week: ? Fill your bathtub halfway with water. ? Pour in  cup of unscented household bleach. ? Soak in the tub for 5-10 minutes. ? Only soak from the neck down. Avoid water on your face and hair. ? Shower to rinse off the bleach from your skin. General instructions  Learn as much as you can about your disease so that you have an active role in your treatment. Work closely with your health care provider to find treatments that work for you.  If you are overweight, work with your health care provider to lose weight as recommended.  Do not use any products that contain nicotine or tobacco, such as cigarettes and e-cigarettes. If you need help quitting, ask your health care provider.  If you struggle with living with this condition, talk with your health care provider or work with a mental health care provider as recommended.  Keep all follow-up visits as told by your health care provider. This is important. Where to find more information  Hidradenitis [place].: https://www.hs-foundation.org/ Contact a health care provider if you have:  A flare-up of hidradenitis suppurativa.  A fever or chills.  Trouble controlling your symptoms at home.  Trouble doing your daily activities because of your symptoms.  Trouble dealing with emotional problems related to your condition. Summary  Hidradenitis suppurativa is a long-term (chronic) skin disease. It is similar to a severe form of acne, but  it affects areas of the body where acne would be unusual.  The first symptoms are usually painful bumps in the skin, similar to pimples. The condition may get worse over time (progress), or it may only cause mild symptoms.  If you have open wounds, cover them with a clean dressing as told by your health care provider. Keep wounds clean by washing them gently with soap and water when you bathe.  Besides skin care, treatment may include medicines, laser treatment, and surgery. This information is not intended to replace advice given to you by your health care provider. Make sure you discuss any questions you have with your health care provider. Document Revised: 07/01/2017 Document Reviewed: 07/01/2017 Elsevier Patient Education  2020 [employer].

## 2019-11-01 NOTE — Progress Notes (Signed)
Patient ID: Thomas [name], male   DOB: Jul 03, 1986, 34 y.o.   MRN: [id number]  Chief Complaint: Hidradenitis suppurativa of buttocks  History of Present Illness Thomas [name] is a 34 y.o. male with various areas of hidradenitis suppurativa, has been dealing with this process over the last 5 years, most recently seen and managed by family practice in [place].  Has persistent/continuing symptoms and referred here.  Treatment/compliance somewhat limited by the patient's history of impulse control disorder, moderate MR, schizoaffective type.  He presents today with some tender areas of bilateral buttocks left worse than right.   He has access to a shower.  It is very challenging for his caregivers to provide the care he needs.  He is currently taking Keflex and doxycycline.  He is applying a topical antifungal to his feet.  Past Medical History Past Medical History:  Diagnosis Date  . Acne   . Anxiety   . Impulse control disorder   . IMPULSE CONTROL DISORDER 08/29/2009   Qualifier: Diagnosis of  By: Denyse Amass MD, Clayburn Pert    . Mental retardation, mild (I.Q. 50-70)   . MENTAL RETARDATION, MODERATE 08/29/2009   Qualifier: Diagnosis of  By: Denyse Amass MD, Clayburn Pert    . Schizo-affective psychosis (HCC)   . Ulcer of foot [facility])       Past Surgical History:  Procedure Laterality Date  . NO PAST SURGERIES      No Known Allergies  Current Outpatient Medications  Medication Sig Dispense Refill  . acetaminophen (TYLENOL) 325 MG tablet Take 650 mg by mouth every 6 (six) hours as needed for mild pain.    . cephALEXin (KEFLEX) 500 MG capsule Take 1 capsule (500 mg total) by mouth 4 (four) times daily for 7 days. 28 capsule 0  . cholecalciferol (VITAMIN D3) 25 MCG (1000 UNIT) tablet Take 1 tablet (1,000 Units total) by mouth daily. 90 tablet 11  . doxycycline (VIBRA-TABS) 100 MG tablet Take 1 tablet (100 mg total) by mouth 2 (two) times daily for 4 days. 8 tablet 0  . haloperidol (HALDOL) 5 MG tablet Take 5 mg by  mouth at bedtime.     Marland Kitchen OLANZapine (ZYPREXA) 10 MG tablet Take 10 mg by mouth at bedtime.    . terbinafine (LAMISIL) 1 % cream Apply 1 application topically 2 (two) times daily. 36 g 0  . clindamycin (CLEOCIN T) 1 % lotion Apply topically 2 (two) times daily. 60 mL 0   No current facility-administered medications for this visit.    Family History No family history on file.    Social History Social History   Tobacco Use  . Smoking status: Current Every Day Smoker    Packs/day: 0.60    Years: 12.00    Pack years: 7.20    Types: Cigarettes  . Smokeless tobacco: Never Used  Substance Use Topics  . Alcohol use: No  . Drug use: No        Review of Systems  Unable to perform ROS: Psychiatric disorder      Physical Exam There were no vitals taken for this visit.   CONSTITUTIONAL: Well developed, and nourished, appropriately responsive and aware without distress.   EYES: Sclera non-icteric.   EARS, NOSE, MOUTH AND THROAT: Mask worn.   Hearing is intact to voice.  NECK: Trachea is midline, and there is no jugular venous distension.  LYMPH NODES:  Lymph nodes in the neck are not enlarged. RESPIRATORY:  Lungs are clear, and breath sounds are equal bilaterally. Normal respiratory  effort without pathologic use of accessory muscles. CARDIOVASCULAR: Heart is regular in rate and rhythm. GI: The abdomen is soft, nontender, and nondistended. There were no palpable masses. I did not appreciate hepatosplenomegaly. There were normal bowel sounds. MUSCULOSKELETAL:  Symmetrical muscle tone appreciated in all four extremities.     SKIN: Skin turgor is normal. No pathologic skin lesions appreciated.  On the buttocks: He has a few areas that chronically draining, there is no area of remarkable induration, no appreciable erythema or cellulitis.  I find no area that is fluctuant.  NEUROLOGIC:  Motor and sensation appear grossly normal.  Cranial nerves are grossly without defect. PSYCH:  Alert  and oriented to person, place and time. Affect is appropriate for situation.  Data Reviewed I have personally reviewed what is currently available of the patient's imaging, recent labs and medical records.   Labs:  CBC Latest Ref Rng & Units 03/29/2019 04/07/2016 03/31/2013  WBC 3.4 - 10.8 x10E3/uL 9.9 11.6(H) 8.5  Hemoglobin 13.0 - 17.7 g/dL 14.3 15.1 15.3  Hematocrit 37.5 - 51.0 % 42.7 45.0 44.2  Platelets 150 - 450 x10E3/uL 264 253 209   CMP Latest Ref Rng & Units 03/29/2019 03/23/2017 04/07/2016  Glucose 65 - 99 mg/dL 85 78 90  BUN 6 - 20 mg/dL 7 8 9   Creatinine 0.76 - 1.27 mg/dL 0.78 0.86 0.86  Sodium 134 - 144 mmol/L 142 144 140  Potassium 3.5 - 5.2 mmol/L 4.3 4.0 4.2  Chloride 96 - 106 mmol/L 103 104 106  CO2 20 - 29 mmol/L 24 25 26   Calcium 8.7 - 10.2 mg/dL 9.5 9.7 9.4  Total Protein 6.0 - 8.5 g/dL 7.6 8.1 7.5  Total Bilirubin 0.0 - 1.2 mg/dL 0.3 0.3 0.3  Alkaline Phos 39 - 117 IU/L 132(H) 168(H) 144(H)  AST 0 - 40 IU/L 28 27 26   ALT 0 - 44 IU/L 34 33 35      Imaging:  Within last 24 hrs: No results found.  Assessment    Hidradenitis suppurativa of bilateral buttocks, with history of other affected areas of no apparent significance at present Patient Active Problem List   Diagnosis Date Noted  . Cellulitis of leg 10/29/2019  . History of vitamin D deficiency 07/22/2019  . Suppurative hidradenitis 07/19/2019  . Tobacco use disorder 03/25/2018  . Schizoaffective disorder, bipolar type ([place])   . Tinea pedis 03/27/2014    Plan    I believe he should continue the oral antibiotics, continuing the doxycycline and near perpetuity.  I can add topical Cleocin lotion to be applied 3 times daily after showering.  There is nothing urgent for incision and drainage at the present time, and the challenges of wound care of excising the area of concern are a bit much, and I have no confidence that the risk is worth the benefit at this time.  We will make referral to dermatology for  long-term medical management.  We will be glad to assist should surgical excision become necessary, but it is a bit of an daunting task and certainly not indicated at the present time.  Face-to-face time spent with the patient and accompanying care providers(if present) was 30 minutes, with more than 50% of the time spent counseling, educating, and coordinating care of the patient.      Ronny Bacon M.D., FACS 11/01/2019, 10:21 AM

## 2019-11-04 ENCOUNTER — Ambulatory Visit (INDEPENDENT_AMBULATORY_CARE_PROVIDER_SITE_OTHER): Payer: Medicaid Other | Admitting: Family Medicine

## 2019-11-04 ENCOUNTER — Other Ambulatory Visit: Payer: Self-pay

## 2019-11-04 ENCOUNTER — Encounter: Payer: Self-pay | Admitting: Family Medicine

## 2019-11-04 VITALS — BP 100/64 | HR 92 | Ht 64.0 in | Wt 129.0 lb

## 2019-11-04 DIAGNOSIS — L03116 Cellulitis of left lower limb: Secondary | ICD-10-CM

## 2019-11-04 DIAGNOSIS — B353 Tinea pedis: Secondary | ICD-10-CM | POA: Diagnosis not present

## 2019-11-04 MED ORDER — DOXYCYCLINE HYCLATE 100 MG PO TABS: 100.0000 mg | ORAL_TABLET | Freq: Two times a day (BID) | ORAL | 0 refills | Status: DC

## 2019-11-04 MED ORDER — CEPHALEXIN 500 MG PO CAPS: 500.0000 mg | ORAL_CAPSULE | Freq: Four times a day (QID) | ORAL | 0 refills | Status: DC

## 2019-11-04 NOTE — Assessment & Plan Note (Signed)
Persistent. Continue terbinafine cream.

## 2019-11-04 NOTE — Assessment & Plan Note (Addendum)
Persistent despite 1 week course of empiric antibiotics though appears to have resolved distally and spread proximally (see prior picture in media tab). Still without systemic symptoms, considering non-infectious etiology such as erythema nodosum, underlying bone disorder, gout. No signs consistent with insect bite, no h/o trauma. No recent med changes to concern for drug reaction. May be inflammatory tinea pedis. Will obtain CBC w/ diff, BMP, CRP, ESR to evaluate further. If labs negative, consider XR to evaluate for underlying bone d/o. Continue antibiotics for additional week, f/u in 1 week.  

## 2019-11-04 NOTE — Progress Notes (Signed)
    SUBJECTIVE:   CHIEF COMPLAINT: cellulitis f/u  HPI:   Seen 4/23 for cellulitis to left lateral ankle, no systemic symptoms at that time.  Had been on p.o. doxycycline.  Extended doxycycline course and added Keflex for possible strep coverage.  Also incidentally noted tinea pedis at that time.  Has almost finished antibiotic course.  Denies fevers, nausea, vomiting.  Feels his rash has gotten worse.  Still with pain to touch and with walking.  PERTINENT  PMH / PSH: Suppurative hidradenitis, schizoaffective disorder, tobacco use  OBJECTIVE:   BP 100/64   Pulse 92   Ht '5\' 4"'$  (1.626 m)   Wt 129 lb (58.5 kg)   SpO2 99%   BMI 22.14 kg/m   Gen: Well appearing, in NAD Skin: ~3-109m area of erythema and warmth with slight induration to L lateral ankle, slightly cephalad to prior exam. No fluctuance or skin abrasions appreciated. Persistent tinea pedis in between 3rd through 5th toes of L foot.      ASSESSMENT/PLAN:   Tinea pedis Persistent. Continue terbinafine cream.  Rash Persistent despite 1 week course of empiric antibiotics though appears to have resolved distally and spread proximally (see prior picture in media tab). Still without systemic symptoms, considering non-infectious etiology such as erythema nodosum, underlying bone disorder, gout. No signs consistent with insect bite, no h/o trauma. No recent med changes to concern for drug reaction. May be inflammatory tinea pedis. Will obtain CBC w/ diff, BMP, CRP, ESR to evaluate further. If labs negative, consider XR to evaluate for underlying bone d/o. Continue antibiotics for additional week, f/u in 1 week.     ARory Percy [place]

## 2019-11-04 NOTE — Patient Instructions (Signed)
It was great to see you!  Our plans for today:  - Continue the antibiotics for another week. - We are checking some labs today, we will call you or send you a letter with these results.  - Continue the cream for your toes, it can take a few weeks to fully go away - Come back next week.  Take care and seek immediate care sooner if you develop any concerns.   Dr. [facility]

## 2019-11-05 LAB — CBC WITH DIFFERENTIAL
Basophils Absolute: 0.1 10*3/uL (ref 0.0–0.2)
Basos: 1 %
EOS (ABSOLUTE): 0.1 10*3/uL (ref 0.0–0.4)
Eos: 1 %
Hematocrit: 39.2 % (ref 37.5–51.0)
Hemoglobin: 12.9 g/dL — ABNORMAL LOW (ref 13.0–17.7)
Immature Grans (Abs): 0 10*3/uL (ref 0.0–0.1)
Immature Granulocytes: 0 %
Lymphocytes Absolute: 2.9 10*3/uL (ref 0.7–3.1)
Lymphs: 26 %
MCH: 28.7 pg (ref 26.6–33.0)
MCHC: 32.9 g/dL (ref 31.5–35.7)
MCV: 87 fL (ref 79–97)
Monocytes Absolute: 0.7 10*3/uL (ref 0.1–0.9)
Monocytes: 6 %
Neutrophils Absolute: 7.2 10*3/uL — ABNORMAL HIGH (ref 1.4–7.0)
Neutrophils: 66 %
RBC: 4.5 x10E6/uL (ref 4.14–5.80)
RDW: 13.5 % (ref 11.6–15.4)
WBC: 10.9 10*3/uL — ABNORMAL HIGH (ref 3.4–10.8)

## 2019-11-05 LAB — BASIC METABOLIC PANEL
BUN/Creatinine Ratio: 9 (ref 9–20)
BUN: 7 mg/dL (ref 6–20)
CO2: 22 mmol/L (ref 20–29)
Calcium: 9.6 mg/dL (ref 8.7–10.2)
Chloride: 105 mmol/L (ref 96–106)
Creatinine, Ser: 0.81 mg/dL (ref 0.76–1.27)
GFR calc Af Amer: 136 mL/min/{1.73_m2} (ref >59)
GFR calc non Af Amer: 118 mL/min/{1.73_m2} (ref >59)
Glucose: 67 mg/dL (ref 65–99)
Potassium: 4.1 mmol/L (ref 3.5–5.2)
Sodium: 141 mmol/L (ref 134–144)

## 2019-11-05 LAB — C-REACTIVE PROTEIN: CRP: 31 mg/L — ABNORMAL HIGH (ref 0–10)

## 2019-11-05 LAB — SEDIMENTATION RATE: Sed Rate: 54 mm/hr — ABNORMAL HIGH (ref 0–15)

## 2019-11-11 ENCOUNTER — Encounter: Payer: Self-pay | Admitting: Family Medicine

## 2019-11-11 ENCOUNTER — Ambulatory Visit (INDEPENDENT_AMBULATORY_CARE_PROVIDER_SITE_OTHER): Payer: Medicaid Other | Admitting: Family Medicine

## 2019-11-11 ENCOUNTER — Other Ambulatory Visit: Payer: Self-pay

## 2019-11-11 VITALS — BP 110/70 | HR 92 | Ht 64.0 in | Wt 128.2 lb

## 2019-11-11 DIAGNOSIS — M79605 Pain in left leg: Secondary | ICD-10-CM

## 2019-11-11 DIAGNOSIS — R21 Rash and other nonspecific skin eruption: Secondary | ICD-10-CM

## 2019-11-11 MED ORDER — SULFAMETHOXAZOLE-TRIMETHOPRIM 800-160 MG PO TABS: 1.0000 | ORAL_TABLET | Freq: Two times a day (BID) | ORAL | 0 refills | Status: AC

## 2019-11-11 NOTE — Patient Instructions (Signed)
It was great to see you!  Our plans for today:  - Go to [facility] to get an XR of your leg. - We are changing your antibiotic, this was sent to the pharmacy. - Keep your appointment with the dermatologist. - Let us know if you develop nausea, vomiting, fevers. - Keep track of if your rash gets better or worse off of antibiotics.  Take care and seek immediate care sooner if you develop any concerns.   Dr. [facility]

## 2019-11-11 NOTE — Progress Notes (Signed)
    SUBJECTIVE:   CHIEF COMPLAINT: Rash follow-up  HPI:   Seen previously 4/23 and 4/30 for erythematous and painful rash to left lateral ankle.  Continued on doxycycline and Keflex for empiric coverage.  Also with incidental tinea pedis of bilateral feet.  Labs notable only for increased ESR and CRP.   Today reports persistence of rash, appears to have gotten better at the bottom and spread at the top. Denies fevers, nausea, vomiting. Normally has regular BMs, about once per day. Has had 4-5x per day since starting antibiotics. No known FH of IBD. No blood in stool. No prior fractures or trauma to area.   PERTINENT  PMH / PSH: Superlative hidradenitis, schizoaffective disorder, tobacco use  OBJECTIVE:   BP 110/70   Pulse 92   Ht '5\' 4"'$  (1.626 m)   Wt 128 lb 4 oz (58.2 kg)   SpO2 99%   BMI 22.01 kg/m   Gen: well appearing, in NAD Skin: ~11x7 cm area of erythema and slight induration noted to L lateral ankle without fluctuance or discharge, tender to palpation. Previous distal area of erythema has resolved with progression of erythema proximally. Foot is spared. Tinea pedis between 4th and 5th toes again noted bilaterally.       ASSESSMENT/PLAN:   Rash Unclear etiology. With resolution distally but progression proximally and TTP. No systemic signs of infection. Labs notable for increased CRP, ESR suggesting infectious or inflammatory process. Considered EN given h/o suppurative hidradenitis although no IBD symptoms or history and with slight improvement with antibiotics, suggests likely infectious process. Also considered inflammatory tinea pedis progression but with sparing of the foot, makes less likely. Will change antibiotics to bactrim for additional week and obtain tib/fib XR to assess for possible OM. Considered biopsy but has derm appointment in 2 weeks, will defer to their workup. Patient is to note if rash worsens off antibiotics prior to derm appointment. Emergency  precautions reviewed.      Rory Percy, [place]

## 2019-11-12 NOTE — Assessment & Plan Note (Signed)
Unclear etiology. With resolution distally but progression proximally and TTP. No systemic signs of infection. Labs notable for increased CRP, ESR suggesting infectious or inflammatory process. Considered EN given h/o suppurative hidradenitis although no IBD symptoms or history and with slight improvement with antibiotics, suggests likely infectious process. Also considered inflammatory tinea pedis progression but with sparing of the foot, makes less likely. Will change antibiotics to bactrim for additional week and obtain tib/fib XR to assess for possible OM. Considered biopsy but has derm appointment in 2 weeks, will defer to their workup. Patient is to note if rash worsens off antibiotics prior to derm appointment. Emergency precautions reviewed.

## 2019-11-13 LAB — MRSA CULTURE: MRSA Screen: NEGATIVE

## 2019-11-14 ENCOUNTER — Telehealth: Payer: Self-pay | Admitting: *Deleted

## 2019-11-14 ENCOUNTER — Ambulatory Visit
Admission: RE | Admit: 2019-11-14 | Discharge: 2019-11-14 | Disposition: A | Discharge status: Home / Self Care | Payer: Medicaid Other | Source: Ambulatory Visit | Attending: Family Medicine | Admitting: Family Medicine

## 2019-11-14 DIAGNOSIS — R21 Rash and other nonspecific skin eruption: Secondary | ICD-10-CM

## 2019-11-14 DIAGNOSIS — M79605 Pain in left leg: Secondary | ICD-10-CM

## 2019-11-14 NOTE — Telephone Encounter (Signed)
Caregiver informed of negative results and they plan to get the xray today.  Kadey Mihalic,CMA

## 2019-11-14 NOTE — Telephone Encounter (Signed)
-----   Message from Ellwood Dense, DO sent at 11/14/2019  8:42 AM EDT ----- Please let patient know of negative results of his nasal swab. He should get the xray done soon.

## 2019-11-24 ENCOUNTER — Other Ambulatory Visit: Payer: Self-pay

## 2019-11-24 ENCOUNTER — Ambulatory Visit (INDEPENDENT_AMBULATORY_CARE_PROVIDER_SITE_OTHER): Payer: Medicaid Other | Admitting: Dermatology

## 2019-11-24 DIAGNOSIS — L819 Disorder of pigmentation, unspecified: Secondary | ICD-10-CM

## 2019-11-24 DIAGNOSIS — L732 Hidradenitis suppurativa: Secondary | ICD-10-CM

## 2019-11-24 DIAGNOSIS — L03116 Cellulitis of left lower limb: Secondary | ICD-10-CM | POA: Diagnosis not present

## 2019-11-24 MED ORDER — CLINDAMYCIN PHOSPHATE 1 % EX LOTN: TOPICAL_LOTION | Freq: Two times a day (BID) | CUTANEOUS | 5 refills | Status: DC

## 2019-11-24 NOTE — Patient Instructions (Signed)
Recommend daily broad spectrum sunscreen SPF 30+ to sun-exposed areas, reapply every 2 hours as needed. Call for new or changing lesions.  

## 2019-11-24 NOTE — Progress Notes (Signed)
   Follow-Up Visit   Subjective  Thomas [name] is a 34 y.o. male who presents for the following: Skin Problem.  He is cared for in a home for mental retardation and schizoaffective disorder, bipolar type.  Patient here today with caregiver Barrington Ellison. Patient has a history of boils in the buttocks area off and on for a few years. He was given doxycycline 100mg  BID bu PCP. He was also given clindamycin solution to use daily. Patient went to [facility] on October 27, 2019 to be evaluated and was referred to our office by Dr. October 29, 2019.   Patient has also been treated for cellulitis at left lower leg/ankle by his PCP October 28, 2019 with a 7 day course of cephalexin which has improved.  The following portions of the chart were reviewed this encounter and updated as appropriate:  Tobacco  Allergies  Meds  Problems  Med Hx  Surg Hx  Fam Hx     Review of Systems:  No other skin or systemic complaints except as noted in HPI or Assessment and Plan.  Objective  Well appearing patient in no apparent distress; mood and affect are within normal limits.  A focused examination was performed including left lower leg, buttocks. Relevant physical exam findings are noted in the Assessment and Plan.  Objective  Left Lower Leg - Anterior: Hyperpigmentation and edema with some peeling. Appears to be resolving. Cont doxycycline 100mg  1 PO BID with food.   Objective  Gluteal Crease: 4 large scarred nodules 2 each side of medial buttocks  Images     Assessment & Plan  Cellulitis of left lower extremity with dyschromia improved with cephalexin treatment no further treatment recommended at this time.  Observe for recurrence. Left Lower Leg - Anterior  Hidradenitis suppurativa severe with nodules and drainage and scarring Gluteal Crease  Cont doxycycline 100mg  1 PO BID with food. Recommend Cln Acne wash. Continue clindamycin solution daily.   Reviewed notes from Dr.  October 30, 2019.  Patient advised this is not curable and that he can benefit from topical, oral, systemic treatment with possible Humira and surgical treatment.  Will send in rx for Humira pending labs. Information given to patient.  We may also consider isotretinoin.  Discussed referral to Dr. at [facility] dermatology if he does not improve on multiple regimens.  Other Related Procedures Comprehensive metabolic panel Lipid panel CBC with Differential/Platelet Hepatitis B surface antigen Hepatitis B surface antibody,qualitative Hepatitis C antibody HIV Antibody (routine testing w rflx) QuantiFERON-TB Gold Plus  Reordered Medications clindamycin (CLEOCIN T) 1 % lotion  Return in about 3 months (around 02/24/2020) for HS.  Rondel Jumbo, RMA, am acting as scribe for [facility], MD . Documentation: I have reviewed the above documentation for accuracy and completeness, and I agree with the above.  02/26/2020, MD

## 2019-11-27 ENCOUNTER — Encounter: Payer: Self-pay | Admitting: Dermatology

## 2019-11-28 ENCOUNTER — Other Ambulatory Visit: Payer: Self-pay

## 2019-11-28 MED ORDER — CLINDAMYCIN PHOSPHATE 1 % EX SOLN
Freq: Two times a day (BID) | CUTANEOUS | 0 refills | Status: DC
Start: 2019-11-28 — End: 2020-03-01

## 2019-11-28 NOTE — Progress Notes (Signed)
Clindamycin solution sent as replacement to Clindamycin Lotion.

## 2019-12-01 ENCOUNTER — Telehealth: Payer: Self-pay

## 2019-12-06 NOTE — Telephone Encounter (Signed)
Error

## 2019-12-15 ENCOUNTER — Telehealth: Payer: Self-pay

## 2019-12-15 NOTE — Telephone Encounter (Signed)
error 

## 2019-12-16 ENCOUNTER — Other Ambulatory Visit: Payer: Self-pay

## 2019-12-16 NOTE — Telephone Encounter (Signed)
Nilda Riggs, QP with group home, calls nurse line requesting rx refill on doxycycline 100 mg tablets to be given in AM and PM. This is not part of his current medication list. Casimiro Needle states that they have been advised to continue doxycycline due to his abscesses. Casimiro Needle can be contacted at [phone].   To PCP  Please advise  Veronda Prude, RN

## 2019-12-16 NOTE — Telephone Encounter (Signed)
I see that the patient is being managed by dermatology long term for his hydradenitis suppurativa. I would prefer if they were to provide the refills going forward. Please let him know to contact me if this is difficult to obtain and I can provide a temporary refill. Please contact patient to inform him of above. Thank you.

## 2019-12-19 ENCOUNTER — Telehealth: Payer: Self-pay

## 2019-12-19 MED ORDER — DOXYCYCLINE HYCLATE 100 MG PO TABS: 100.0000 mg | ORAL_TABLET | Freq: Two times a day (BID) | ORAL | 2 refills | Status: AC

## 2019-12-19 NOTE — Telephone Encounter (Signed)
Called and informed Thomas [name] that he should contact the dermatologist that originally RX'd the doxycycline.  Mr. Thomas [name] states that patient does not have a dermatologist but the he will try and contact the doctor that patient was referred to.  Thomas [name], CMA

## 2019-12-19 NOTE — Telephone Encounter (Signed)
Care giver has called and asked for RF of Doxycycline. RX sent in.

## 2020-02-28 ENCOUNTER — Other Ambulatory Visit: Payer: Self-pay

## 2020-02-28 MED ORDER — TERBINAFINE HCL 1 % EX CREA: 1.0000 "application " | TOPICAL_CREAM | Freq: Two times a day (BID) | CUTANEOUS | 0 refills | Status: DC

## 2020-03-01 ENCOUNTER — Other Ambulatory Visit: Payer: Self-pay

## 2020-03-01 MED ORDER — CLINDAMYCIN PHOSPHATE 1 % EX SOLN: Freq: Two times a day (BID) | CUTANEOUS | 0 refills | Status: AC

## 2020-03-01 NOTE — Progress Notes (Signed)
Patient's caregiver called asking for RF of Clindamycin. RF approved.

## 2020-03-02 ENCOUNTER — Other Ambulatory Visit: Payer: Self-pay

## 2020-03-02 ENCOUNTER — Ambulatory Visit (INDEPENDENT_AMBULATORY_CARE_PROVIDER_SITE_OTHER): Payer: Medicaid Other | Admitting: Family Medicine

## 2020-03-02 ENCOUNTER — Encounter: Payer: Self-pay | Admitting: Family Medicine

## 2020-03-02 VITALS — BP 120/80 | HR 88 | Ht 63.0 in | Wt 130.8 lb

## 2020-03-02 DIAGNOSIS — F250 Schizoaffective disorder, bipolar type: Secondary | ICD-10-CM

## 2020-03-02 DIAGNOSIS — F17200 Nicotine dependence, unspecified, uncomplicated: Secondary | ICD-10-CM | POA: Diagnosis not present

## 2020-03-02 DIAGNOSIS — Z0000 Encounter for general adult medical examination without abnormal findings: Secondary | ICD-10-CM | POA: Diagnosis not present

## 2020-03-02 DIAGNOSIS — Z8639 Personal history of other endocrine, nutritional and metabolic disease: Secondary | ICD-10-CM | POA: Diagnosis not present

## 2020-03-02 NOTE — Patient Instructions (Addendum)
It was nice to meet you.   Today you were seen for your physical exam.  You are doing well.  Consider cutting back to 3-4 cigarrettes daily. Overall quitting smoking will benefit your health.   Consider increasing your activity to walking 3 times a day.   Continue your balanced diet and taking your vitamin d supplement.   Follow up in 1 year or sooner as needed.   Dr. Salvadore Dom, DO

## 2020-03-02 NOTE — Progress Notes (Signed)
    SUBJECTIVE:   Chief compliant/HPI: annual examination  Thomas [name] is a 34 y.o. who presents today for an annual exam. He is here with his group home leader. He is doing and does not have any concerns.   Reviewed and updated history.   Review of systems form unremarkable.    OBJECTIVE:   BP 120/80   Pulse 88   Ht 5\' 3"  (1.6 m)   Wt 130 lb 12.8 oz (59.3 kg)   SpO2 99%   BMI 23.17 kg/m    General: Appears well, no acute distress. Age appropriate. Cardiac: RRR, normal heart sounds, no murmurs Respiratory: CTAB, normal effort Abdomen: soft, nontender, nondistended Extremities: No edema or cyanosis. Skin: Warm and dry, no rashes noted Neuro: alert and oriented, no focal deficits Psych: normal affect PHQ9 SCORE ONLY 03/02/2020 11/11/2019 10/28/2019  PHQ-9 Total Score 0 0 0    ASSESSMENT/PLAN:   Schizoaffective disorder, bipolar type [facility].  Tobacco use disorder Current smoking. Not ready to quit smoking. Discussed decreasing amount of cigarettes from 7 to 3 or 4.   History of vitamin D deficiency Continue taking vitamin d supplement.   Annual Examination  See AVS for age appropriate recommendations  PHQ score 0, reviewed.  Blood pressure reviewed and at goal.    Considered the following items based upon USPSTF recommendations: HIV testing: requested  Hepatitis C: not ordered, will order as other blood work needed Hepatitis B: not indicated Syphilis if at high risk: not indicated GC/CTnot indicated Lipid panel (nonfasting or fasting) discussed based upon AHA recommendations and not ordered last panel <1 year ago.  Consider repeat every 4-6 years.   Immunizations; Needs COVID vaccine, discussed with patient does not desire at this time.  Follow up in 1 year or sooner if indicated.    10/30/2019, DO [facility]

## 2020-03-05 ENCOUNTER — Other Ambulatory Visit: Payer: Self-pay

## 2020-03-05 ENCOUNTER — Ambulatory Visit (INDEPENDENT_AMBULATORY_CARE_PROVIDER_SITE_OTHER): Payer: Medicaid Other | Admitting: Dermatology

## 2020-03-05 DIAGNOSIS — L700 Acne vulgaris: Secondary | ICD-10-CM | POA: Diagnosis not present

## 2020-03-05 DIAGNOSIS — L732 Hidradenitis suppurativa: Secondary | ICD-10-CM

## 2020-03-05 NOTE — Patient Instructions (Signed)
Hidradenitis - Improved. Will plan to start Isotretinoin pending labs. Lab order given today. Continue Doxycycline 100 mg 1 tablet twice daily until starting Isotretinoin. Will call with labs results as soon as we receive them and will send in Isotretinoin to [facility] in [place].  Acne of face - Same plan as above.

## 2020-03-05 NOTE — Progress Notes (Signed)
   Follow-Up Visit   Subjective  Thomas [name] is a 34 y.o. male who presents for the following: Follow-up (Hidradenitis follow up of gluteal crease. I improved but still has some discharge. He does not have to use a pillow to sit anymore. He is taking Doxycycline bid and using Clindamycin solution daily.).  The following portions of the chart were reviewed this encounter and updated as appropriate:  Tobacco  Allergies  Meds  Problems  Med Hx  Surg Hx  Fam Hx     Review of Systems:  No other skin or systemic complaints except as noted in HPI or Assessment and Plan.  Objective  Well appearing patient in no apparent distress; mood and affect are within normal limits.  A focused examination was performed including face, buttocks. Relevant physical exam findings are noted in the Assessment and Plan.  Objective  Medial buttocks: 4 large scarred nodules 2 each side of medial buttock  Objective  Face: Scarring and active papules of face.   Assessment & Plan  Hidradenitis suppurativa -severe with scarring and active drainage.  Some improvement on current regimen of doxycycline and topicals, but not enough and we would like to pursue isotretinoin at this time.  Patient wants to pursue this also.  He will continue to have surgical options and Humira as an option in the future. Medial buttocks  Discussed Isotretinoin. Consent form signed today and patient registered in Ipledge system. Ipledge #[id number] and patient uses [facility] in [place], [place].  Continue Doxycycline 100mg  1 po bid until starting Isotretinoin. 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 Will plan to start Isotretinoin pending labs  CBC with Differential/Platelet - Face  Comprehensive  metabolic panel - Face  Lipid panel - Face  Return in about 30 days (around 04/04/2020).  I, 04/06/2020, CMA, am acting as scribe for Joanie Coddington, MD .  Documentation: I have reviewed the above documentation for accuracy and completeness, and I agree with the above.  Armida Sans, MD

## 2020-03-06 ENCOUNTER — Encounter: Payer: Self-pay | Admitting: Family Medicine

## 2020-03-06 NOTE — Assessment & Plan Note (Signed)
Continue taking vitamin d supplement.

## 2020-03-06 NOTE — Assessment & Plan Note (Signed)
Current smoking. Not ready to quit smoking. Discussed decreasing amount of cigarettes from 7 to 3 or 4.

## 2020-03-06 NOTE — Assessment & Plan Note (Signed)
[facility] therapy.

## 2020-03-09 ENCOUNTER — Encounter: Payer: Self-pay | Admitting: Dermatology

## 2020-03-14 ENCOUNTER — Telehealth: Payer: Self-pay

## 2020-03-14 DIAGNOSIS — L732 Hidradenitis suppurativa: Secondary | ICD-10-CM

## 2020-03-14 LAB — COMPREHENSIVE METABOLIC PANEL
ALT: 16 IU/L (ref 0–44)
AST: 16 IU/L (ref 0–40)
Albumin/Globulin Ratio: 1.2 (ref 1.2–2.2)
Albumin: 4.4 g/dL (ref 4.0–5.0)
Alkaline Phosphatase: 159 IU/L — ABNORMAL HIGH (ref 48–121)
BUN/Creatinine Ratio: 9 (ref 9–20)
BUN: 8 mg/dL (ref 6–20)
Bilirubin Total: 0.3 mg/dL (ref 0.0–1.2)
CO2: 26 mmol/L (ref 20–29)
Calcium: 9.9 mg/dL (ref 8.7–10.2)
Chloride: 100 mmol/L (ref 96–106)
Creatinine, Ser: 0.9 mg/dL (ref 0.76–1.27)
GFR calc Af Amer: 130 mL/min/{1.73_m2} (ref >59)
GFR calc non Af Amer: 113 mL/min/{1.73_m2} (ref >59)
Globulin, Total: 3.6 g/dL (ref 1.5–4.5)
Glucose: 93 mg/dL (ref 65–99)
Potassium: 4.8 mmol/L (ref 3.5–5.2)
Sodium: 139 mmol/L (ref 134–144)
Total Protein: 8 g/dL (ref 6.0–8.5)

## 2020-03-14 LAB — CBC WITH DIFFERENTIAL/PLATELET
Basophils Absolute: 0.1 10*3/uL (ref 0.0–0.2)
Basos: 1 %
EOS (ABSOLUTE): 0.1 10*3/uL (ref 0.0–0.4)
Eos: 1 %
Hematocrit: 44.7 % (ref 37.5–51.0)
Hemoglobin: 14.2 g/dL (ref 13.0–17.7)
Immature Grans (Abs): 0 10*3/uL (ref 0.0–0.1)
Immature Granulocytes: 0 %
Lymphocytes Absolute: 2.8 10*3/uL (ref 0.7–3.1)
Lymphs: 28 %
MCH: 28.1 pg (ref 26.6–33.0)
MCHC: 31.8 g/dL (ref 31.5–35.7)
MCV: 88 fL (ref 79–97)
Monocytes Absolute: 0.6 10*3/uL (ref 0.1–0.9)
Monocytes: 6 %
Neutrophils Absolute: 6.5 10*3/uL (ref 1.4–7.0)
Neutrophils: 64 %
Platelets: 304 10*3/uL (ref 150–450)
RBC: 5.06 x10E6/uL (ref 4.14–5.80)
RDW: 13 % (ref 11.6–15.4)
WBC: 10 10*3/uL (ref 3.4–10.8)

## 2020-03-14 LAB — LIPID PANEL
Chol/HDL Ratio: 4.9 ratio (ref 0.0–5.0)
Cholesterol, Total: 199 mg/dL (ref 100–199)
HDL: 41 mg/dL (ref >39)
LDL Chol Calc (NIH): 145 mg/dL — ABNORMAL HIGH (ref 0–99)
Triglycerides: 70 mg/dL (ref 0–149)
VLDL Cholesterol Cal: 13 mg/dL (ref 5–40)

## 2020-03-14 MED ORDER — ISOTRETINOIN 20 MG PO CAPS: ORAL_CAPSULE | ORAL | 0 refills | Status: DC

## 2020-03-14 NOTE — Telephone Encounter (Signed)
-----  Message from Ralene Bathe, MD sent at 03/14/2020 10:10 AM EDT ----- Lab OK Some elevation in Alk Phos Mild anemia - Hgb low D/C Doxycycline Start Isotretinoin 20 mg 1 po qd #30 no rf  -- send in Keep 1 month fu appt

## 2020-03-14 NOTE — Telephone Encounter (Signed)
Patient's care giver informed of lab results and advised to D/C Doxycycline. Patient confirmed in Ipledge and medication sent to pharmacy.

## 2020-04-04 ENCOUNTER — Ambulatory Visit (INDEPENDENT_AMBULATORY_CARE_PROVIDER_SITE_OTHER): Payer: Medicaid Other | Admitting: Dermatology

## 2020-04-04 ENCOUNTER — Other Ambulatory Visit: Payer: Self-pay

## 2020-04-04 ENCOUNTER — Encounter: Payer: Self-pay | Admitting: Dermatology

## 2020-04-04 VITALS — Wt 143.0 lb

## 2020-04-04 DIAGNOSIS — L853 Xerosis cutis: Secondary | ICD-10-CM | POA: Diagnosis not present

## 2020-04-04 DIAGNOSIS — L700 Acne vulgaris: Secondary | ICD-10-CM

## 2020-04-04 DIAGNOSIS — L732 Hidradenitis suppurativa: Secondary | ICD-10-CM

## 2020-04-04 DIAGNOSIS — K130 Diseases of lips: Secondary | ICD-10-CM | POA: Diagnosis not present

## 2020-04-04 DIAGNOSIS — Z79899 Other long term (current) drug therapy: Secondary | ICD-10-CM

## 2020-04-04 MED ORDER — ISOTRETINOIN 30 MG PO CAPS
30.0000 mg | ORAL_CAPSULE | Freq: Every day | ORAL | 0 refills | Status: DC
Start: 2020-04-04 — End: 2020-05-10

## 2020-04-04 NOTE — Patient Instructions (Signed)
Reviewed potential side effects of isotretinoin including xerosis, cheilitis, hepatitis, hyperlipidemia, and birth defects if taken by a pregnant woman. Reviewed reports of suicidal ideation in those with a history of depression while taking isotretinoin and reports of diagnosis of inflammatory bowl disease while taking isotretinoin as well as the lack of evidence for a causal relationship between isotretinoin, depression and IBD. Patient advised to reach out with any questions or concerns.  While taking isotretinoin, do not share pills and do not donate blood. Isotretinoin is best absorbed when taken with a fatty meal. Isotretinoin can make you sensitive to the sun. Daily careful sun protection including sunscreen SPF 30+ when outdoors is recommended.

## 2020-04-04 NOTE — Progress Notes (Signed)
   Isotretinoin Follow-Up Visit   Subjective  Thomas [name] is a 34 y.o. male who presents for the following: Acne (and severe hidradenitis follow up - Isotretinoin week #3 follow up -  20 mg qd).  Week # 4   Isotretinoin F/U - 04/04/20 1200      Isotretinoin Follow Up   iPledge # [id number]    Date 04/04/20    Weight 143 lb (64.9 kg)      Dosage   Current (To Date) Dosage (mg) 20 mg      Side Effects   Skin WNL    Gastrointestinal WNL    Neurological WNL    Constitutional WNL    Other Side Effects itching           Side effects: Dry skin, dry lips  Denies changes in night vision, shortness of breath, abdominal pain, nausea, vomiting, diarrhea, blood in stool or urine, visual changes, headaches, epistaxis, joint pain, myalgias, mood changes, depression, or suicidal ideation.   The following portions of the chart were reviewed this encounter and updated as appropriate: medications, allergies, medical history  Review of Systems:  No other skin or systemic complaints except as noted in HPI or Assessment and Plan.  Objective  Well appearing patient in no apparent distress; mood and affect are within normal limits.  An examination of the face, neck, chest, and back was performed and relevant findings are noted below.   Objective  Face: Active papules and scarring   Assessment & Plan   Hidradenitis suppurativa Medial buttocks Hidradenitis suppurativa -severe with scarring and active drainage.  Patient states he has seen a little bit of improvement.  Patient weighs 59.3 kg. Total dose to date is 420 mg (7 mg/kg)  Increase Isotretinoin 30 mg 1 po qd.  Ipledge #[id number] [name]  ISOtretinoin (ACCUTANE) 30 MG capsule - Medial buttocks  Acne vulgaris -severe with scarring week #4 isotretinoin Face Isotretinoin 30 mg 1 po qd as prescribed  Xerosis - Continue emollients as directed  Cheilitis - Continue lip balm as directed, Dr. Clayborne Artist  Cortibalm recommended  Long term medication management (isotretinoin) - While taking Isotretinoin and for 30 days after you finish the medication, do not share pills, do not donate blood. Isotretinoin is best absorbed when taken with a fatty meal. Isotretinoin can make you sensitive to the sun. Daily careful sun protection including sunscreen SPF 30+ when outdoors is recommended.  Follow-up in 30 days.  I, Joanie Coddington, CMA, am acting as scribe for Armida Sans, MD .  Documentation: I have reviewed the above documentation for accuracy and completeness, and I agree with the above.  Armida Sans, MD

## 2020-05-10 ENCOUNTER — Other Ambulatory Visit: Payer: Self-pay

## 2020-05-10 ENCOUNTER — Ambulatory Visit (INDEPENDENT_AMBULATORY_CARE_PROVIDER_SITE_OTHER): Payer: Medicaid Other | Admitting: Dermatology

## 2020-05-10 VITALS — Wt 143.0 lb

## 2020-05-10 DIAGNOSIS — L905 Scar conditions and fibrosis of skin: Secondary | ICD-10-CM | POA: Diagnosis not present

## 2020-05-10 DIAGNOSIS — L701 Acne conglobata: Secondary | ICD-10-CM

## 2020-05-10 DIAGNOSIS — L732 Hidradenitis suppurativa: Secondary | ICD-10-CM | POA: Diagnosis not present

## 2020-05-10 DIAGNOSIS — Z79899 Other long term (current) drug therapy: Secondary | ICD-10-CM

## 2020-05-10 MED ORDER — ISOTRETINOIN 40 MG PO CAPS: 40.0000 mg | ORAL_CAPSULE | Freq: Every day | ORAL | 0 refills | Status: AC

## 2020-05-10 NOTE — Progress Notes (Signed)
   Follow-Up Visit   Subjective  Thomas [name] is a 34 y.o. male who presents for the following: Follow-up acne and hidradenitis on isotretinoin (Hidradenitis follow up Week #8 - Isotretinoin 30 mg qd).  The following portions of the chart were reviewed this encounter and updated as appropriate:  Tobacco  Allergies  Meds  Problems  Med Hx  Surg Hx  Fam Hx     Review of Systems:  No other skin or systemic complaints except as noted in HPI or Assessment and Plan.  Objective  Well appearing patient in no apparent distress; mood and affect are within normal limits.  A focused examination was performed including face. Relevant physical exam findings are noted in the Assessment and Plan.  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  The patient feels like he is improving.  Ipledge #[id number]  Patient weighs 65 kg. Current dose to date is 1500 mg (23.08 mg/kg). Target dose is 150 mg/kg (9750 mg total dose).  ISOtretinoin (ACCUTANE) 40 MG capsule - buttocks  Long term medication management.  Return in about 1 month (around 06/09/2020) for Isotretinoin.   I, Joanie Coddington, CMA, am acting as scribe for Armida Sans, MD .  Documentation: I have reviewed the above documentation for accuracy and completeness, and I agree with the above.  Armida Sans, MD

## 2020-05-11 ENCOUNTER — Encounter: Payer: Self-pay | Admitting: Dermatology

## 2020-06-14 ENCOUNTER — Ambulatory Visit (INDEPENDENT_AMBULATORY_CARE_PROVIDER_SITE_OTHER): Payer: Medicaid Other | Admitting: Dermatology

## 2020-06-14 ENCOUNTER — Other Ambulatory Visit: Payer: Self-pay

## 2020-06-14 VITALS — Wt 143.0 lb

## 2020-06-14 DIAGNOSIS — L853 Xerosis cutis: Secondary | ICD-10-CM | POA: Diagnosis not present

## 2020-06-14 DIAGNOSIS — Z79899 Other long term (current) drug therapy: Secondary | ICD-10-CM

## 2020-06-14 DIAGNOSIS — K130 Diseases of lips: Secondary | ICD-10-CM | POA: Diagnosis not present

## 2020-06-14 DIAGNOSIS — L732 Hidradenitis suppurativa: Secondary | ICD-10-CM | POA: Diagnosis not present

## 2020-06-14 MED ORDER — ISOTRETINOIN 30 MG PO CAPS
ORAL_CAPSULE | ORAL | 0 refills | Status: DC
Start: 2020-06-14 — End: 2020-07-30

## 2020-06-14 NOTE — Progress Notes (Signed)
   Follow-Up Visit   Subjective  Thomas [name] is a 34 y.o. male who presents for the following: HS (Isotretinoin 40mg  po QD week 12 - patient c/o chelitis and occasional joint aches/pain.).  The following portions of the chart were reviewed this encounter and updated as appropriate:   Tobacco  Allergies  Meds  Problems  Med Hx  Surg Hx  Fam Hx     Review of Systems:  No other skin or systemic complaints except as noted in HPI or Assessment and Plan.  Objective  Well appearing patient in no apparent distress; mood and affect are within normal limits.  A focused examination was performed including buttocks . Relevant physical exam findings are noted in the Assessment and Plan.   Assessment & Plan  Hidradenitis suppurativa Severe; On Isotretinoin -  requiring FDA mandated monthly evaluations and laboratory monitoring; Chronic and Persistent; Not to Goal Buttocks, groin  Isotretinoin week 12 - chronic, not at goal.   Increase Isotretinoin to 30mg  2 po QD. #60 0RF. While taking isotretinoin, do not share pills and do not donate blood. Isotretinoin is best absorbed when taken with a fatty meal. Isotretinoin can make you sensitive to the sun. Daily careful sun protection including sunscreen SPF 30+ when outdoors is recommended.    Isotretinoin F/U - 06/14/20 1100       Isotretinoin Follow Up   iPledge #    Date 06/14/20    Weight 143 lb (64.9 kg)    Acne breakouts since last visit? Yes      Dosage   Target Dosage (mg) 9750mg     Current (To Date) Dosage (mg) 2700mg     To Go Dosage (mg) 7050mg       Side Effects   Skin Chapped Lips    Gastrointestinal WNL    Neurological WNL    Constitutional Muscle/joint aches               ISOtretinoin (ACCUTANE) 30 MG capsule - Buttocks, groin  Xerosis Secondary to Isotretinoin Treatment - diffuse xerotic patches - recommend gentle, hydrating skin care - gentle skin care handout given  Cheilitis Secondary to  Isotretinoin Treatment - Continue lip balm as directed, Dr. [id number] Cortibalm or Aquaphor recommended   Return in about 1 month (around 07/15/2020) for Isotretinoin follow up .  , CMA, am acting as scribe for , MD .  Documentation: I have reviewed the above documentation for accuracy and completeness, and I agree with the above.  , MD

## 2020-06-14 NOTE — Patient Instructions (Signed)
Acne is chronic, severe, not at goal.  While taking isotretinoin, do not share pills and do not donate blood. Isotretinoin is best absorbed when taken with a fatty meal. Isotretinoin can make you sensitive to the sun. Daily careful sun protection including sunscreen SPF 30+ when outdoors is recommended. 

## 2020-06-20 ENCOUNTER — Encounter: Payer: Self-pay | Admitting: Dermatology

## 2020-07-23 ENCOUNTER — Ambulatory Visit: Payer: Medicaid Other | Admitting: Dermatology

## 2020-07-30 ENCOUNTER — Other Ambulatory Visit: Payer: Self-pay

## 2020-07-30 ENCOUNTER — Ambulatory Visit (INDEPENDENT_AMBULATORY_CARE_PROVIDER_SITE_OTHER): Payer: Medicaid Other | Admitting: Dermatology

## 2020-07-30 VITALS — Wt 143.0 lb

## 2020-07-30 DIAGNOSIS — L853 Xerosis cutis: Secondary | ICD-10-CM | POA: Diagnosis not present

## 2020-07-30 DIAGNOSIS — L700 Acne vulgaris: Secondary | ICD-10-CM | POA: Diagnosis not present

## 2020-07-30 DIAGNOSIS — L732 Hidradenitis suppurativa: Secondary | ICD-10-CM | POA: Diagnosis not present

## 2020-07-30 DIAGNOSIS — Z79899 Other long term (current) drug therapy: Secondary | ICD-10-CM

## 2020-07-30 DIAGNOSIS — K130 Diseases of lips: Secondary | ICD-10-CM | POA: Diagnosis not present

## 2020-07-30 MED ORDER — ISOTRETINOIN 40 MG PO CAPS: 40.0000 mg | ORAL_CAPSULE | Freq: Two times a day (BID) | ORAL | 0 refills | Status: AC

## 2020-07-30 NOTE — Patient Instructions (Signed)
Acne is chronic, severe, not at goal.  While taking Isotretinoin and for 30 days after you finish the medication, do not get pregnant, do not share pills, do not donate blood. Isotretinoin is best absorbed when taken with a fatty meal. Isotretinoin can make you sensitive to the sun. Daily careful sun protection including sunscreen SPF 30+ when outdoors is recommended.  

## 2020-07-30 NOTE — Progress Notes (Signed)
   Isotretinoin Follow-Up Visit   Subjective  Thomas [name] is a 35 y.o. male who presents for the following: Follow-up (Hidradenitis follow up - week #16 - 30 days Isotretinoin 30 mg 2 po qd - he has been out of medication for about a week).  Week # 16   Isotretinoin F/U - 07/30/20 1100      Isotretinoin Follow Up   iPledge # [id number]    Date 07/30/20    Weight 143 lb (64.9 kg)    Acne breakouts since last visit? Yes      Dosage   Target Dosage (mg) 9750 mg    Current (To Date) Dosage (mg) 4500 mg    To Go Dosage (mg) 5250 mg      Side Effects   Skin Chapped Lips    Gastrointestinal WNL    Neurological WNL    Constitutional WNL           Side effects: Dry skin, dry lips  Denies changes in night vision, shortness of breath, abdominal pain, nausea, vomiting, diarrhea, blood in stool or urine, visual changes, headaches, epistaxis, joint pain, myalgias, mood changes, depression, or suicidal ideation.   The following portions of the chart were reviewed this encounter and updated as appropriate: medications, allergies, medical history  Review of Systems:  No other skin or systemic complaints except as noted in HPI or Assessment and Plan.  Objective  Well appearing patient in no apparent distress; mood and affect are within normal limits.  An examination of the face, neck, chest, and back was performed and relevant findings are noted below.   Objective  face: 1 active papule of nose with some scarring of face.   Assessment & Plan   Acne vulgaris face Severe; On Isotretinoin -  requiring FDA mandated monthly evaluations and laboratory monitoring; Chronic and Persistent; Not to goal. Increase Isotretinoin 40 mg 2 po qd  Ipledge # [id number] [name]  Will plan labs on follow up.  Total dose to date - 4500 mg Patient weight - 64.9 kg Total mg/kg - 69.3 mg/kg  ISOtretinoin (ACCUTANE) 40 MG capsule - face  Hidradenitis  suppurativa buttocks, groin Severe; On Isotretinoin -  requiring FDA mandated monthly evaluations and laboratory monitoring; Chronic and Persistent; Not to Goal Increase Isotretinoin 40 mg 2 po qd  Will plan labs on follow up  ISOtretinoin (ACCUTANE) 40 MG capsule - buttocks, groin   Xerosis secondary to isotretinoin therapy - Continue emollients as directed  Cheilitis secondary to isotretinoin therapy - Continue lip balm as directed, Dr. Clayborne Artist Cortibalm recommended  Long term medication management (isotretinoin) - While taking Isotretinoin and for 30 days after you finish the medication, do not share pills, do not donate blood. Isotretinoin is best absorbed when taken with a fatty meal. Isotretinoin can make you sensitive to the sun. Daily careful sun protection including sunscreen SPF 30+ when outdoors is recommended.  Follow-up in 30 days.  I, Joanie Coddington, CMA, am acting as scribe for Armida Sans, MD .  Documentation: I have reviewed the above documentation for accuracy and completeness, and I agree with the above.  Armida Sans, MD

## 2020-07-31 ENCOUNTER — Encounter: Payer: Self-pay | Admitting: Dermatology

## 2020-08-08 ENCOUNTER — Other Ambulatory Visit: Payer: Self-pay | Admitting: Family Medicine

## 2020-08-08 DIAGNOSIS — E559 Vitamin D deficiency, unspecified: Secondary | ICD-10-CM

## 2020-08-30 ENCOUNTER — Other Ambulatory Visit: Payer: Self-pay

## 2020-08-30 ENCOUNTER — Ambulatory Visit (INDEPENDENT_AMBULATORY_CARE_PROVIDER_SITE_OTHER): Payer: Medicaid Other | Admitting: Dermatology

## 2020-08-30 VITALS — Wt 143.0 lb

## 2020-08-30 DIAGNOSIS — L700 Acne vulgaris: Secondary | ICD-10-CM | POA: Diagnosis not present

## 2020-08-30 DIAGNOSIS — Z79899 Other long term (current) drug therapy: Secondary | ICD-10-CM

## 2020-08-30 DIAGNOSIS — L732 Hidradenitis suppurativa: Secondary | ICD-10-CM

## 2020-08-30 DIAGNOSIS — K130 Diseases of lips: Secondary | ICD-10-CM

## 2020-08-30 DIAGNOSIS — L853 Xerosis cutis: Secondary | ICD-10-CM

## 2020-08-30 MED ORDER — CEPHALEXIN 500 MG PO CAPS: 500.0000 mg | ORAL_CAPSULE | Freq: Two times a day (BID) | ORAL | 0 refills | Status: AC

## 2020-08-30 NOTE — Progress Notes (Signed)
Isotretinoin Follow-Up Visit   Subjective  Byrant Valent is a 35 y.o. male who presents for the following: Acne (face)  & Hidradenitis (body) - wk 20 Isotretinoin 40mg  2 po qd, one new bump in groin ~3 days, has not drained but seems to be improving today.).  Week # 16 Patient accompanied by Care giver who contributed to history.   Isotretinoin F/U - 08/30/20 1000      Isotretinoin Follow Up   iPledge # 09/01/20    Date 08/30/20    Weight 143 lb (64.9 kg)    Acne breakouts since last visit? No      Dosage   Target Dosage (mg) 9,750 mg    Current (To Date) Dosage (mg) 6,900 mg    To Go Dosage (mg) 2,850 mg      Side Effects   Skin Chapped Lips;Dry Lips;Dry Eyes;Dry Skin    Gastrointestinal WNL    Neurological WNL    Constitutional Fatigue    Other Side Effects pt had a bump in groin           Side effects: Dry skin, dry lips  Denies changes in night vision, shortness of breath, abdominal pain, nausea, vomiting, diarrhea, blood in stool or urine, visual changes, headaches, epistaxis, joint pain, myalgias, mood changes, depression, or suicidal ideation.   The following portions of the chart were reviewed this encounter and updated as appropriate: medications, allergies, medical history  Review of Systems:  No other skin or systemic complaints except as noted in HPI or Assessment and Plan.  Objective  Well appearing patient in no apparent distress; mood and affect are within normal limits.  An examination of the face, neck, chest, and back was performed and relevant findings are noted below.   Objective  face: Scarring face  Objective  groin: Nodule groin   Assessment & Plan   Acne vulgaris and hidradenitis week #20 isotretinoin -improving face Severe; On Isotretinoin -  requiring FDA mandated monthly evaluations and laboratory monitoring; Chronic and Persistent; Not to Goal Severe; On Isotretinoin -  requiring FDA mandated monthly evaluations and  laboratory monitoring; Chronic and Persistent; Not to Goal  Isotretinoin wk 20 IPLEDGE# 09/01/20 Pharmacy [facility]   Total mg to date = 6,900 Total mg/kg to date 106.32  Pending labs cont Isotretinoin 40mg  2 po qd  Other Related Procedures CBC with Differential/Platelet Comprehensive metabolic panel Lipid panel  Hidradenitis suppurativa groin Severe; On Isotretinoin -  requiring FDA mandated monthly evaluations and laboratory monitoring; Chronic and Persistent; Not to Goal Severe; On Isotretinoin -  requiring FDA mandated monthly evaluations and laboratory monitoring; Chronic and Persistent; Not to Goal  Isotretinoin wk 20 IPLEDGE# [facility] Pharmacy [facility]   Total mg to date = 6,900 Total mg/kg to date 106.32  Pending labs cont Isotretinoin 40mg  2 po qd  Start Cephalexin 250mg  1 po bid x 7 days for flare of groin cyst. If not improving after a week we can I&D if has not drained on its own. He may return after 1 week if needed.  cephALEXin (KEFLEX) 500 MG capsule - groin  Other Related Procedures CBC with Differential/Platelet Comprehensive metabolic panel Lipid panel   Xerosis secondary to isotretinoin therapy - Continue emollients as directed  Cheilitis secondary to isotretinoin therapy - Continue lip balm as directed, Dr. [phone] Cortibalm recommended  Long term medication management (isotretinoin) - While taking Isotretinoin and for 30 days after you finish the medication, do not  share pills, do not donate blood. Isotretinoin is best absorbed when taken with a fatty meal. Isotretinoin can make you sensitive to the sun. Daily careful sun protection including sunscreen SPF 30+ when outdoors is recommended.  Follow-up in 30 days.  I, Ardis Rowan, RMA, am acting as scribe for Armida Sans, MD .  Documentation: I have reviewed the above documentation for accuracy and completeness, and I agree with the  above.  Armida Sans, MD

## 2020-09-01 ENCOUNTER — Encounter: Payer: Self-pay | Admitting: Dermatology

## 2020-09-05 ENCOUNTER — Telehealth: Payer: Self-pay

## 2020-09-05 DIAGNOSIS — L700 Acne vulgaris: Secondary | ICD-10-CM

## 2020-09-05 LAB — CBC WITH DIFFERENTIAL/PLATELET
Basophils Absolute: 0 10*3/uL (ref 0.0–0.2)
Basos: 0 %
EOS (ABSOLUTE): 0.1 10*3/uL (ref 0.0–0.4)
Eos: 1 %
Hematocrit: 42.2 % (ref 37.5–51.0)
Hemoglobin: 14 g/dL (ref 13.0–17.7)
Immature Grans (Abs): 0 10*3/uL (ref 0.0–0.1)
Immature Granulocytes: 0 %
Lymphocytes Absolute: 2.6 10*3/uL (ref 0.7–3.1)
Lymphs: 28 %
MCH: 28.4 pg (ref 26.6–33.0)
MCHC: 33.2 g/dL (ref 31.5–35.7)
MCV: 86 fL (ref 79–97)
Monocytes Absolute: 0.5 10*3/uL (ref 0.1–0.9)
Monocytes: 6 %
Neutrophils Absolute: 6 10*3/uL (ref 1.4–7.0)
Neutrophils: 65 %
Platelets: 285 10*3/uL (ref 150–450)
RBC: 4.93 x10E6/uL (ref 4.14–5.80)
RDW: 13.6 % (ref 11.6–15.4)
WBC: 9.3 10*3/uL (ref 3.4–10.8)

## 2020-09-05 LAB — LIPID PANEL
Chol/HDL Ratio: 5.2 ratio — ABNORMAL HIGH (ref 0.0–5.0)
Cholesterol, Total: 191 mg/dL (ref 100–199)
HDL: 37 mg/dL — ABNORMAL LOW (ref >39)
LDL Chol Calc (NIH): 141 mg/dL — ABNORMAL HIGH (ref 0–99)
Triglycerides: 70 mg/dL (ref 0–149)
VLDL Cholesterol Cal: 13 mg/dL (ref 5–40)

## 2020-09-05 LAB — COMPREHENSIVE METABOLIC PANEL
ALT: 13 IU/L (ref 0–44)
AST: 17 IU/L (ref 0–40)
Albumin/Globulin Ratio: 1.3 (ref 1.2–2.2)
Albumin: 4.4 g/dL (ref 4.0–5.0)
Alkaline Phosphatase: 150 IU/L — ABNORMAL HIGH (ref 44–121)
BUN/Creatinine Ratio: 9 (ref 9–20)
BUN: 7 mg/dL (ref 6–20)
Bilirubin Total: 0.2 mg/dL (ref 0.0–1.2)
CO2: 22 mmol/L (ref 20–29)
Calcium: 9.6 mg/dL (ref 8.7–10.2)
Chloride: 109 mmol/L — ABNORMAL HIGH (ref 96–106)
Creatinine, Ser: 0.8 mg/dL (ref 0.76–1.27)
Globulin, Total: 3.4 g/dL (ref 1.5–4.5)
Glucose: 76 mg/dL (ref 65–99)
Potassium: 4.2 mmol/L (ref 3.5–5.2)
Sodium: 146 mmol/L — ABNORMAL HIGH (ref 134–144)
Total Protein: 7.8 g/dL (ref 6.0–8.5)
eGFR: 120 mL/min/{1.73_m2} (ref >59)

## 2020-09-05 MED ORDER — ISOTRETINOIN 40 MG PO CAPS: 40.0000 mg | ORAL_CAPSULE | Freq: Two times a day (BID) | ORAL | 0 refills | Status: AC

## 2020-09-05 NOTE — Telephone Encounter (Signed)
-----  Message from Ralene Bathe, MD sent at 09/05/2020  9:56 AM EST ----- Lab from 09/04/20 is OK. Slight elevation of Alk Phos (a liver test) but is actually better than had been in past several months. May continue Isotretinoin plan.  Call and advise pt.  Send in Isotretinoin as per note.  Keep 1 mont fu appt

## 2020-09-05 NOTE — Telephone Encounter (Signed)
Advised patient's caregiver of lab results/hd

## 2020-10-03 ENCOUNTER — Other Ambulatory Visit: Payer: Self-pay

## 2020-10-03 ENCOUNTER — Ambulatory Visit (INDEPENDENT_AMBULATORY_CARE_PROVIDER_SITE_OTHER): Payer: Medicaid Other | Admitting: Dermatology

## 2020-10-03 VITALS — Wt 143.0 lb

## 2020-10-03 DIAGNOSIS — Z79899 Other long term (current) drug therapy: Secondary | ICD-10-CM | POA: Diagnosis not present

## 2020-10-03 DIAGNOSIS — L732 Hidradenitis suppurativa: Secondary | ICD-10-CM | POA: Diagnosis not present

## 2020-10-03 DIAGNOSIS — L853 Xerosis cutis: Secondary | ICD-10-CM | POA: Diagnosis not present

## 2020-10-03 DIAGNOSIS — K130 Diseases of lips: Secondary | ICD-10-CM

## 2020-10-03 MED ORDER — ISOTRETINOIN 40 MG PO CAPS: ORAL_CAPSULE | ORAL | 0 refills | Status: DC

## 2020-10-03 NOTE — Patient Instructions (Signed)

## 2020-10-03 NOTE — Progress Notes (Signed)
   Follow-Up Visit   Subjective  Thomas [name] is a 35 y.o. male who presents for the following: hidradenitis  (Patient currently using Isotretinoin 40mg  2 po QD and has been on 24 weeks of treatment. ).  The following portions of the chart were reviewed this encounter and updated as appropriate:   Tobacco  Allergies  Meds  Problems  Med Hx  Surg Hx  Fam Hx     Review of Systems:  No other skin or systemic complaints except as noted in HPI or Assessment and Plan.  Objective  Well appearing patient in no apparent distress; mood and affect are within normal limits.  A focused examination was performed including the face. Relevant physical exam findings are noted in the Assessment and Plan.  Objective  Pubic: Clear per patient   Assessment & Plan  Hidradenitis suppurativa -severe on isotretinoin week number of 24 Pubic Week 24 - Acne is chronic, severe, not at goal. Reviewed labs.  Severe; On Isotretinoin -  requiring FDA mandated monthly evaluations and laboratory monitoring; Chronic and Persistent; Not to Goal  While taking isotretinoin, do not share pills and do not donate blood. Isotretinoin is best absorbed when taken with a fatty meal. Isotretinoin can make you sensitive to the sun. Daily careful sun protection including sunscreen SPF 30+ when outdoors is recommended.   Continue Isotretinoin 40mg  2 po QD. #60 0RF.   Total dosage to date 143.3mg /kg - recommend at least 2 more months of treatment  ISOtretinoin (ACCUTANE) 40 MG capsule - Pubic   Isotretinoin F/U - 10/03/20 0900      Isotretinoin Follow Up   iPledge #    Date 10/03/20    Weight 143 lb (64.9 kg)    Acne breakouts since last visit? No      Dosage   Target Dosage (mg) 12,980    Current (To Date) Dosage (mg) 9,300    To Go Dosage (mg) 3,680      Side Effects   Skin Chapped Lips;Dry Lips;Dry Skin    Gastrointestinal WNL    Neurological WNL    Constitutional WNL          Xerosis  Secondary to Isotretinoin  - diffuse xerotic patches - recommend gentle, hydrating skin care - gentle skin care handout given  Cheilitis Secondary to Isotretinoin  - Continue lip balm as directed, Dr. 10/05/20 Cortibalm or Aquaphor recommended  Return in about 1 month (around 11/03/2020) for Isotretinoin follow up .  Clayborne Artist, CMA, am acting as scribe for 11/05/2020, MD .  Documentation: I have reviewed the above documentation for accuracy and completeness, and I agree with the above.  Maylene Roes, MD

## 2020-10-04 ENCOUNTER — Encounter: Payer: Self-pay | Admitting: Dermatology

## 2020-11-08 ENCOUNTER — Ambulatory Visit: Payer: Medicaid Other | Admitting: Dermatology

## 2020-11-14 NOTE — Progress Notes (Signed)
    SUBJECTIVE:   Chief compliant/HPI: check up  Thomas [name] is a 35 y.o. who presents today for check up  PMH: suppurativ hydradenitis followed by dermatology, history of Vit D deficiency, schizoaffective disorder, tobacco use disorder  Social History: Alcohol: No Tobacco: 5-6 cigs/day Illicit Drugs: None Safe at home: yes Depression/Suicidality: No Exercise: lifting weights, playing basketball/football  Health Maintenance: Health Maintenance Due  Topic  . Hepatitis C Screening    Review of systems: see HPI  OBJECTIVE:   BP (!) 144/103   Pulse 85   Wt 137 lb 9.6 oz (62.4 kg)   SpO2 100%   BMI 24.37 kg/m   General: pleasant young male, sitting comfortably on exam bed, well nourished, well developed, in no acute distress with non-toxic appearance CV: regular rate and rhythm without murmurs, rubs, or gallops Lungs: clear to auscultation bilaterally with normal work of breathing on room air, speaking in full sentences Skin: warm, dry MSK: gait normal Neuro: Alert and oriented, speech normal   ASSESSMENT/PLAN:     Tobacco use disorder Chronic, current everyday smoker of 5-6 cigs/day. Not interested in quitting at this time. Declined nicotine replacement. Discussed goal of cutting back to 2-3 cigs/day. Caregiver noted he would try to help work with him. Smoking cessation strongly encouraged given elevated blood pressure.   Elevated blood pressure [name] without diagnosis of hypertension BP 144/103. Repeat by Dr. Mauri [name] 151/105. New for patient. Unable to check BP at home. Discussed ambulatory BP vs follow up 1 month. Opted for follow up in 1 month.  Follow up 1 month for nurses visit for BP recheck.  Strongly encouraged to stop smoking If elevated at follow up with have patient follow up to discuss initiation of antihypertension   Health Maintenance: - due for Hep C - recommend obtaining at annual exam with rest of annual labs  Follow up 1 month for BP  check Follow up in August 2022 for annual exam   Joana Reamer, DO [facility]

## 2020-11-15 ENCOUNTER — Ambulatory Visit (INDEPENDENT_AMBULATORY_CARE_PROVIDER_SITE_OTHER): Payer: Medicaid Other | Admitting: Family Medicine

## 2020-11-15 ENCOUNTER — Encounter: Payer: Self-pay | Admitting: Family Medicine

## 2020-11-15 ENCOUNTER — Other Ambulatory Visit: Payer: Self-pay

## 2020-11-15 VITALS — BP 144/103 | HR 85 | Wt 137.6 lb

## 2020-11-15 DIAGNOSIS — F17200 Nicotine dependence, unspecified, uncomplicated: Secondary | ICD-10-CM | POA: Diagnosis not present

## 2020-11-15 DIAGNOSIS — R030 Elevated blood-pressure reading, without diagnosis of hypertension: Secondary | ICD-10-CM

## 2020-11-15 NOTE — Patient Instructions (Signed)
It was a pleasure to see you today!  Thank you for choosing [facility] for your primary care.   Our plans for today were:  Your blood pressure was elevated. Please follow up on 12/10/20 at 10:30 am for a nurses visit for blood pressure check. If elevated we will need to follow up to discuss starting medications  Strongly encourage you to cut back on cigarettes - goal cut down to 2-3.  Follow up in August 2022 for annual exam   Best Wishes,   Orpah Cobb, DO

## 2020-11-16 DIAGNOSIS — I10 Essential (primary) hypertension: Secondary | ICD-10-CM | POA: Insufficient documentation

## 2020-11-16 NOTE — Assessment & Plan Note (Signed)
Chronic, current everyday smoker of 5-6 cigs/day. Not interested in quitting at this time. Declined nicotine replacement. Discussed goal of cutting back to 2-3 cigs/day. Caregiver noted he would try to help work with him. Smoking cessation strongly encouraged given elevated blood pressure.

## 2020-11-16 NOTE — Assessment & Plan Note (Addendum)
BP 144/103. Repeat by Dr. Mauri Reading 151/105. New for patient. Unable to check BP at home. Discussed ambulatory BP vs follow up 1 month. Opted for follow up in 1 month.  Follow up 1 month for nurses visit for BP recheck.  Strongly encouraged to stop smoking If elevated at follow up with have patient follow up to discuss initiation of antihypertension

## 2020-11-17 IMAGING — CR DG TIBIA/FIBULA 2V*L*
4 series · 4 of 4 positions shown · non-contrast
Comparison: None.

CLINICAL DATA: Left lower extremity pain and rash. Cellulitis with
symptoms for 1 month. Concern for osteomyelitis.

EXAM:
LEFT TIBIA AND FIBULA - 2 VIEW

[t tib/fib ap left (1 of 2)]
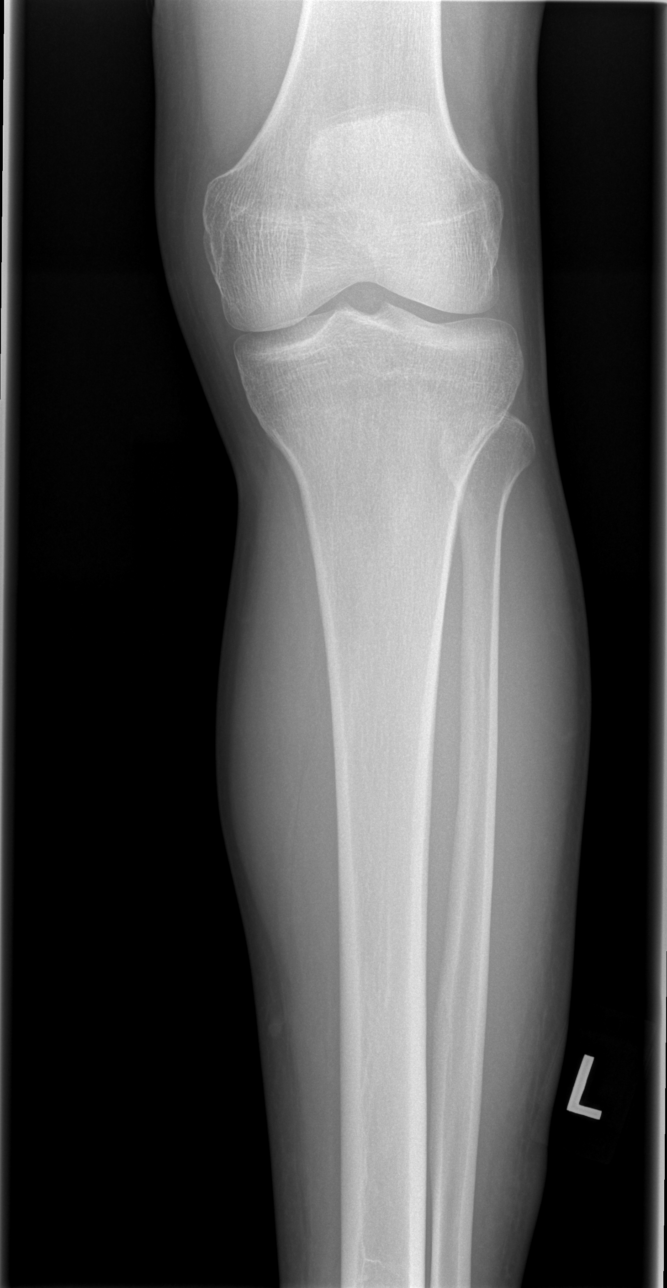

[t tib/fib ap left (2 of 2)]
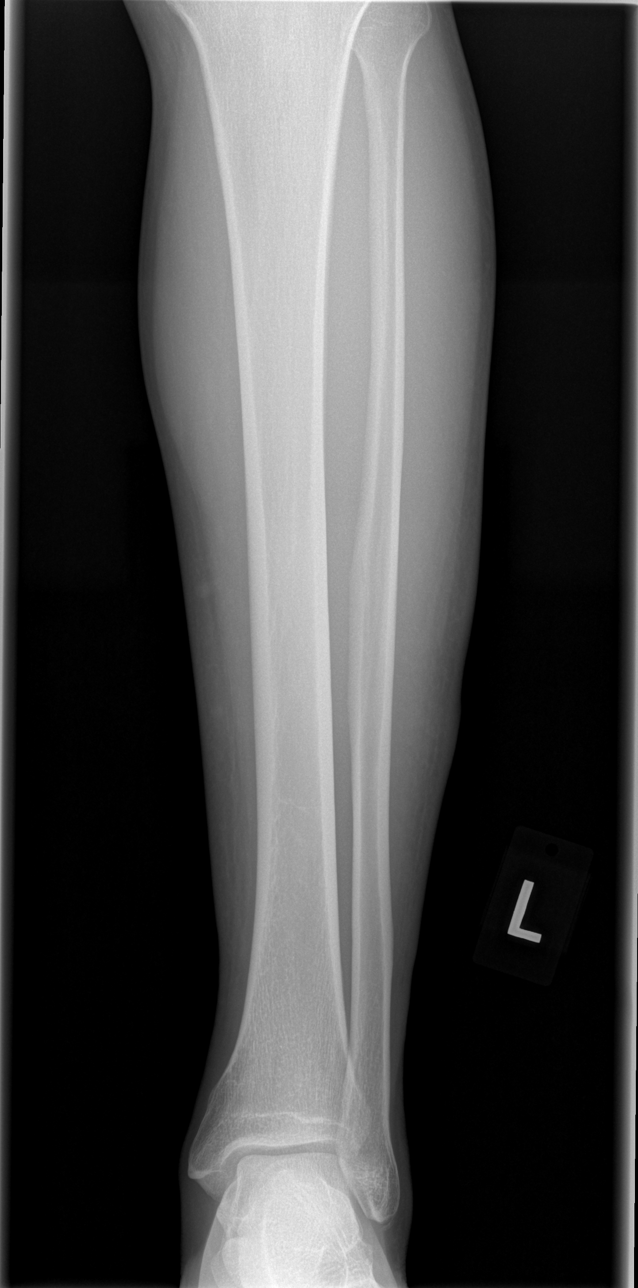

[t tib/fib lat left (1 of 2)]
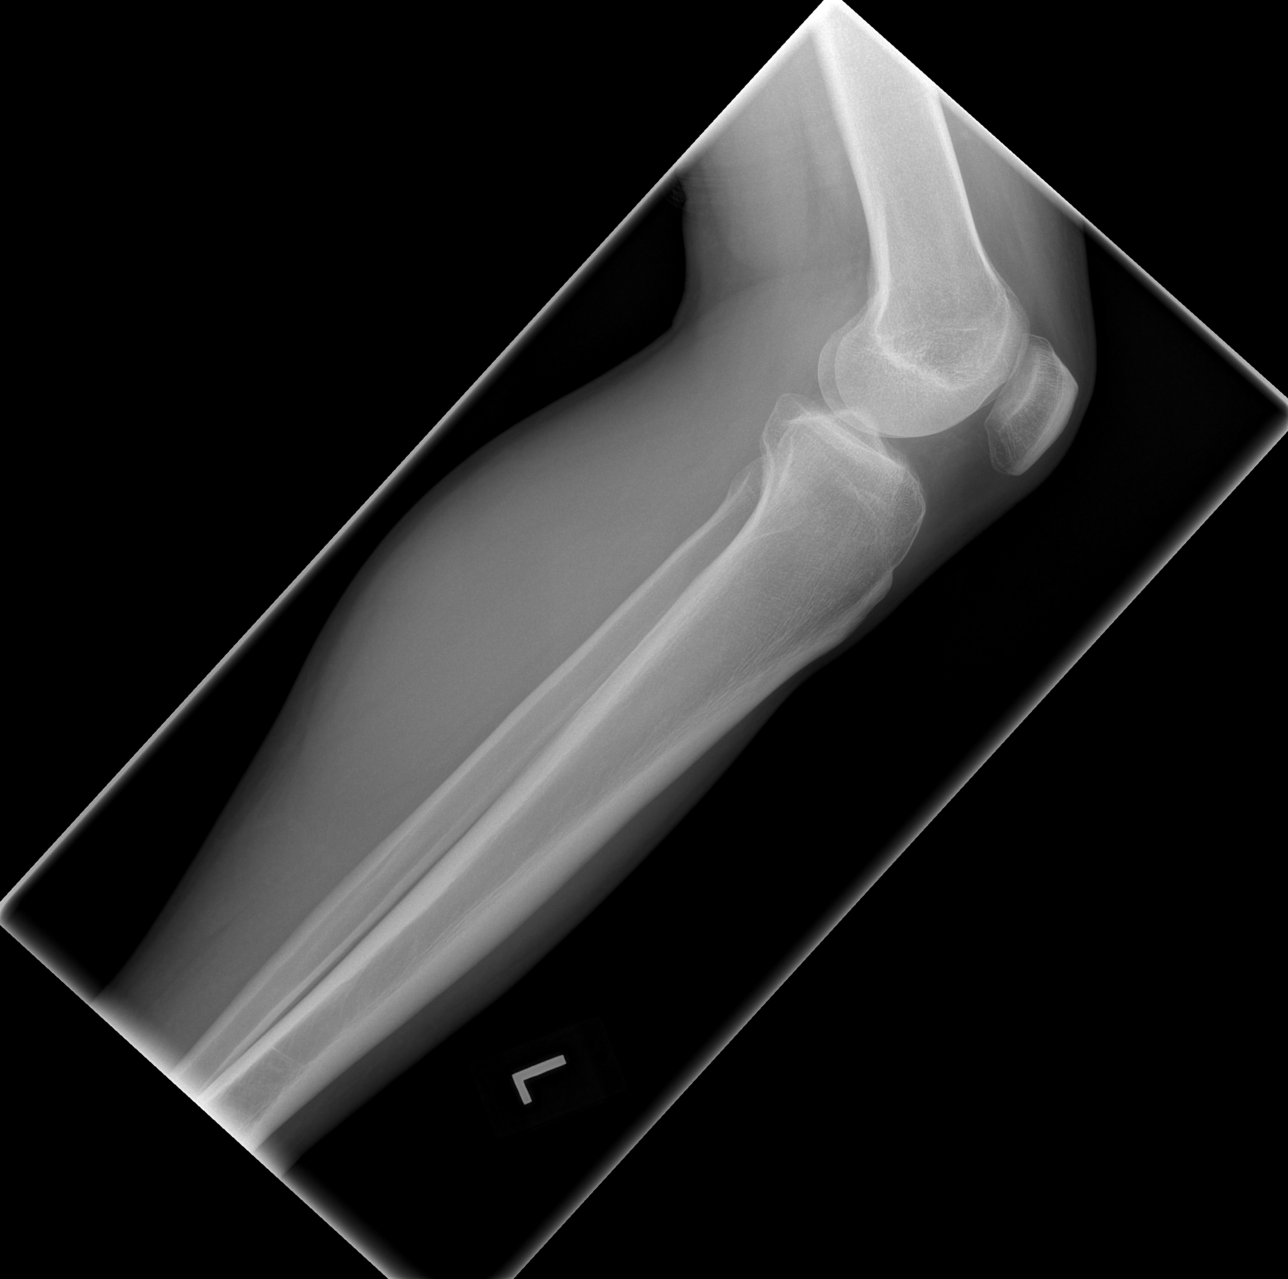

[t tib/fib lat left (2 of 2)]
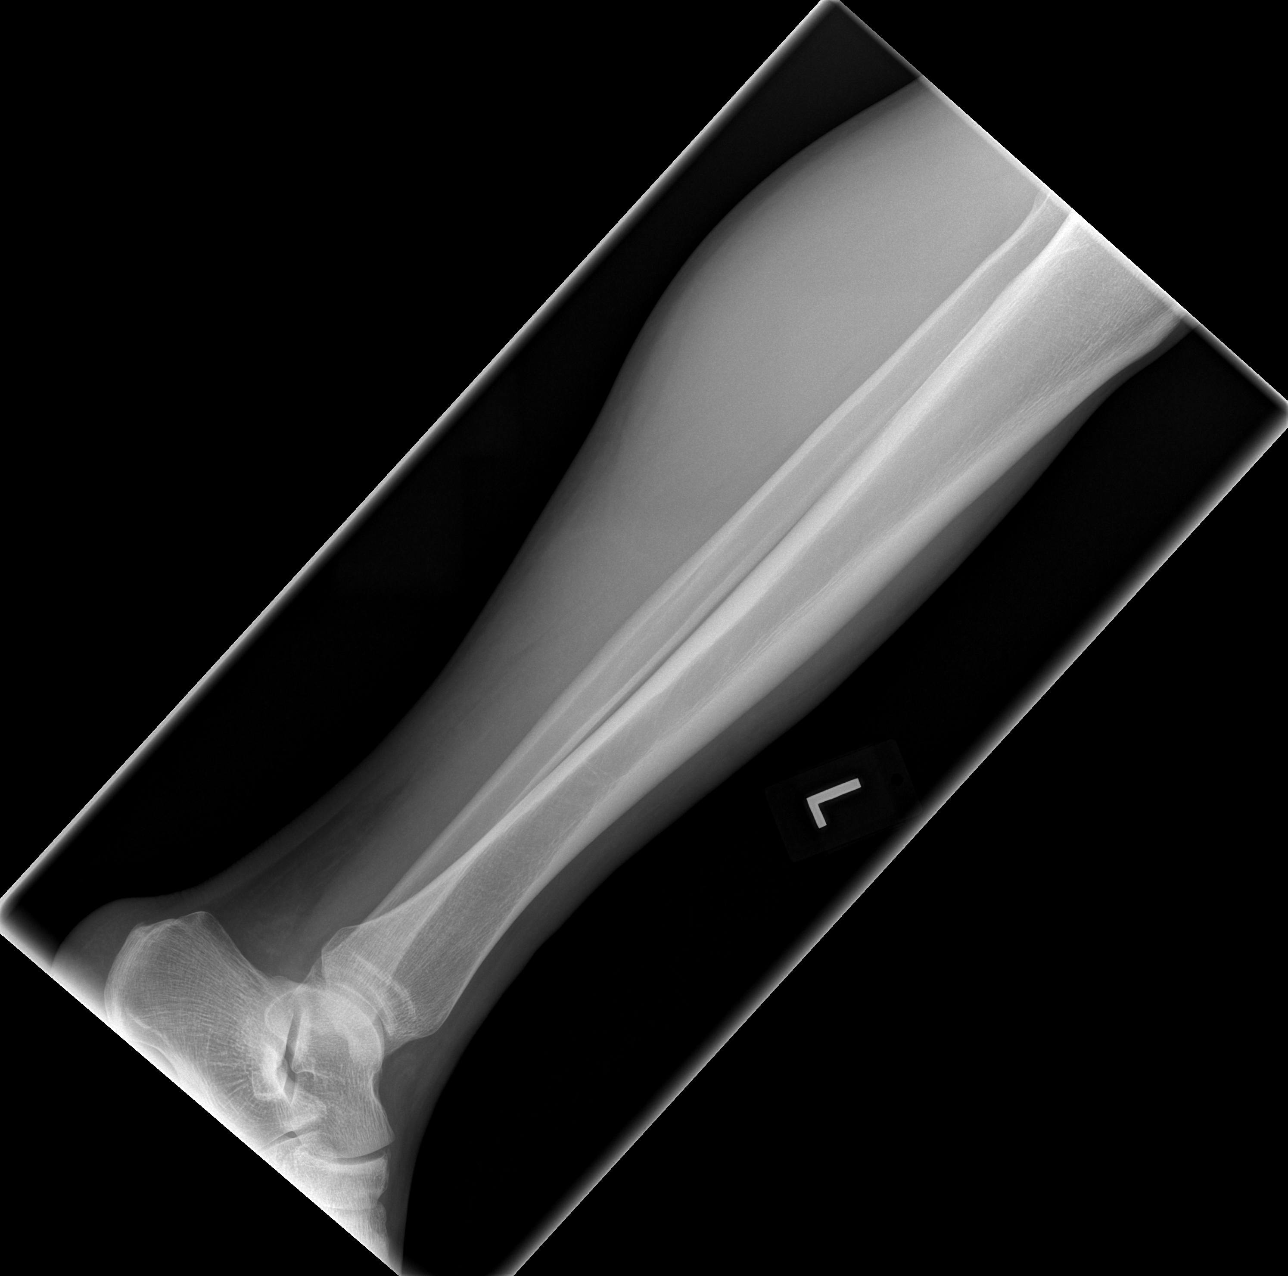

[4 of 4 positions shown; findings below may reference images not displayed]

FINDINGS: No fracture or osseous erosion is identified. The knee and ankle are
located. There is likely mild soft tissue swelling along the
anterior and lateral aspects of the mid to distal lower leg. No
radiopaque foreign body or soft tissue emphysema is identified.
IMPRESSION: No evidence of osteomyelitis or other acute osseous abnormality.

## 2020-11-26 ENCOUNTER — Other Ambulatory Visit: Payer: Self-pay

## 2020-11-26 ENCOUNTER — Ambulatory Visit (INDEPENDENT_AMBULATORY_CARE_PROVIDER_SITE_OTHER): Payer: Medicaid Other | Admitting: Dermatology

## 2020-11-26 VITALS — Wt 137.0 lb

## 2020-11-26 DIAGNOSIS — L732 Hidradenitis suppurativa: Secondary | ICD-10-CM

## 2020-11-26 DIAGNOSIS — K130 Diseases of lips: Secondary | ICD-10-CM | POA: Diagnosis not present

## 2020-11-26 DIAGNOSIS — Z79899 Other long term (current) drug therapy: Secondary | ICD-10-CM

## 2020-11-26 DIAGNOSIS — L853 Xerosis cutis: Secondary | ICD-10-CM | POA: Diagnosis not present

## 2020-11-26 MED ORDER — ISOTRETINOIN 40 MG PO CAPS: ORAL_CAPSULE | ORAL | 0 refills | Status: DC

## 2020-11-26 NOTE — Patient Instructions (Signed)

## 2020-11-26 NOTE — Progress Notes (Signed)
   Isotretinoin Follow-Up Visit   Subjective  Thomas [name] is a 35 y.o. male who presents for the following: Acne (Patient here today for follow up on acne. He reports no new break outs since last visit. ).  Week # 28   Isotretinoin F/U - 11/26/20 1200      Isotretinoin Follow Up   iPledge # [id number]    Date 11/26/20    Weight 137 lb (62.1 kg)    Acne breakouts since last visit? No      Side Effects   Skin Chapped Lips;Dry Eyes;Dry Lips;Dry Nose;Hair Loss    Gastrointestinal Diarrhea    Neurological WNL    Constitutional WNL           Side effects: Dry skin, dry lips  Denies changes in night vision, shortness of breath, abdominal pain, nausea, vomiting, diarrhea, blood in stool or urine, visual changes, headaches, epistaxis, joint pain, myalgias, mood changes, depression, or suicidal ideation.   The following portions of the chart were reviewed this encounter and updated as appropriate: medications, allergies, medical history  Review of Systems:  No other skin or systemic complaints except as noted in HPI or Assessment and Plan.  Objective  Well appearing patient in no apparent distress; mood and affect are within normal limits.  An examination of the face, neck, chest, and back was performed and relevant findings are noted below.   Objective  Pubic: Clear per patient    Assessment & Plan   Hidradenitis suppurativa and Acne Pubic Hidradenitis suppurativa -severe on isotretinoin week number of 24 Acne is chronic, severe, not at goal. Reviewed labs.  Severe; On Isotretinoin -  requiring FDA mandated monthly evaluations and laboratory monitoring; Chronic and Persistent; Not to Goal   While taking isotretinoin, do not share pills and do not donate blood. Isotretinoin is best absorbed when taken with a fatty meal. Isotretinoin can make you sensitive to the sun. Daily careful sun protection including sunscreen SPF 30+ when outdoors is recommended.    Continue  Isotretinoin 40mg  2 po QD. #60 0RF.    Total dosage to date 187.50mg /kg - recommend at least 1 - 2 more months of treatment  Reordered Medications ISOtretinoin (ACCUTANE) 40 MG capsule   Xerosis secondary to isotretinoin therapy - Continue emollients as directed  Cheilitis secondary to isotretinoin therapy - Continue lip balm as directed, Dr. Cortibalm recommended  Long term medication management (isotretinoin) - While taking Isotretinoin and for 30 days after you finish the medication, do not share pills, do not donate blood. Isotretinoin is best absorbed when taken with a fatty meal. Isotretinoin can make you sensitive to the sun. Daily careful sun protection including sunscreen SPF 30+ when outdoors is recommended.  Follow-up in 30 days. I , CMA, am acting as scribe for Clayborne Artist, MD.  Documentation: I have reviewed the above documentation for accuracy and completeness, and I agree with the above.  Asher Muir, MD

## 2020-12-01 ENCOUNTER — Encounter: Payer: Self-pay | Admitting: Dermatology

## 2020-12-05 ENCOUNTER — Other Ambulatory Visit: Payer: Self-pay | Admitting: Family Medicine

## 2020-12-05 DIAGNOSIS — E559 Vitamin D deficiency, unspecified: Secondary | ICD-10-CM

## 2020-12-06 ENCOUNTER — Other Ambulatory Visit: Payer: Self-pay | Admitting: Family Medicine

## 2020-12-06 DIAGNOSIS — E559 Vitamin D deficiency, unspecified: Secondary | ICD-10-CM

## 2020-12-07 MED ORDER — VITAMIN D (CHOLECALCIFEROL) 25 MCG (1000 UT) PO TABS: ORAL_TABLET | ORAL | 99 refills | Status: DC

## 2020-12-07 NOTE — Telephone Encounter (Signed)
Received phone call from patient's caregiver ([facility] provider) regarding rx needing to be changed from [facility] to [facility].   Called and canceled at [facility] pharmacy. Resent to [place].   Veronda Prude, RN

## 2020-12-07 NOTE — Addendum Note (Signed)
Addended by: Glori Bickers C on: 12/07/2020 11:13 AM   Modules accepted: Orders

## 2020-12-10 ENCOUNTER — Other Ambulatory Visit: Payer: Self-pay

## 2020-12-10 ENCOUNTER — Ambulatory Visit (INDEPENDENT_AMBULATORY_CARE_PROVIDER_SITE_OTHER): Payer: Medicaid Other

## 2020-12-10 DIAGNOSIS — Z0130 Encounter for examination of blood pressure without abnormal findings: Secondary | ICD-10-CM

## 2020-12-10 NOTE — Patient Instructions (Addendum)
Today you were seen for a BP check. Your BP was 138/98. I have scheduled you a follow up appointment with Dr. Mauri Reading on 6/23 at 10:30 for further evaluation of your elevated BP readings. If possible, we recommend that you measure your BP once daily and keep a log of your readings and then bring that to your visit with Dr. Mauri Reading.   Please contact our office if you begin experiencing any signs of elevated BP.  * severe headache that does not go away with pain reducer.  *blurry vision * chest pain  *dizziness  Have a nice day.   Veronda Prude, RN

## 2020-12-12 ENCOUNTER — Other Ambulatory Visit: Payer: Self-pay | Admitting: Dermatology

## 2020-12-12 DIAGNOSIS — L732 Hidradenitis suppurativa: Secondary | ICD-10-CM

## 2020-12-14 NOTE — Progress Notes (Signed)
Patient here today for BP check.  Patient presents with caregiver, Saul Fordyce.   Last BP was on 11/15/2020 and was 144/103.  BP today is 138/98 with a pulse of 83.    Checked BP in right arm with regular adult cuff.    Symptoms present: none.   Precepted with Dr. Deirdre Priest who recommends that patient follow up with PCP for further evaluation of elevated BP. Scheduled follow up for 6/23 at 1030. Provided with strict return precautions.   Recommended measuring home BP if possible and keeping a log of measurements to bring to next visit.   Patient's caregiver voices understanding.   Veronda Prude, RN

## 2020-12-18 ENCOUNTER — Ambulatory Visit: Payer: Medicaid Other

## 2020-12-25 NOTE — Progress Notes (Signed)
   Subjective:   Patient ID: Thomas [name]    DOB: 1986-03-18, 35 y.o. male   MRN: [id number]  Serge Main is a 35 y.o. male with a history of suppurative hidradenitis, tobacco use disorder, schizoaffective disorder, h/o Vitamin D deficiency here for elevated bP  Elevated BP without diagnosis of HTN: Has history of multiple elevated BP's in office. He has not been checking BP's at home. Denies headaches, chest pain, SOB, vision changes. Has never been on an antihypertensive before.  Review of Systems:  Per HPI.   Objective:   BP (!) 122/98   Pulse 87   Ht 5\' 3"  (1.6 m)   Wt 145 lb (65.8 kg)   SpO2 98%   BMI 25.69 kg/m  Vitals and nursing note reviewed.  General: pleasant young male, sitting comfortably in exam chair, well nourished, well developed, in no acute distress with non-toxic appearance Resp: breathing comfortably on room air, speaking in full sentences MSK: gait normal Neuro: Alert and oriented, speech normal  Assessment & Plan:   Elevated blood pressure reading without diagnosis of hypertension Multiple elevated blood pressure readings in office. Normotensive today. No prior history of HTN.  Refer to Dr. for 24-hour ambulatory BP monitor.   No orders of the defined types were placed in this encounter.  No orders of the defined types were placed in this encounter.     Raymondo Band, DO PGY-3, [facility] 12/27/2020 1:25 PM

## 2020-12-26 ENCOUNTER — Ambulatory Visit (INDEPENDENT_AMBULATORY_CARE_PROVIDER_SITE_OTHER): Payer: Medicaid Other | Admitting: Dermatology

## 2020-12-26 ENCOUNTER — Other Ambulatory Visit: Payer: Self-pay

## 2020-12-26 VITALS — Wt 137.0 lb

## 2020-12-26 DIAGNOSIS — L853 Xerosis cutis: Secondary | ICD-10-CM | POA: Diagnosis not present

## 2020-12-26 DIAGNOSIS — K130 Diseases of lips: Secondary | ICD-10-CM | POA: Diagnosis not present

## 2020-12-26 DIAGNOSIS — Z79899 Other long term (current) drug therapy: Secondary | ICD-10-CM | POA: Diagnosis not present

## 2020-12-26 DIAGNOSIS — L732 Hidradenitis suppurativa: Secondary | ICD-10-CM

## 2020-12-26 MED ORDER — ISOTRETINOIN 40 MG PO CAPS: ORAL_CAPSULE | ORAL | 0 refills | Status: DC

## 2020-12-26 NOTE — Patient Instructions (Addendum)
If you have any questions or concerns for your doctor, please call our main line at [phone] and press option 4 to reach your doctor's medical assistant. If no one answers, please leave a voicemail as directed and we will return your call as soon as possible. Messages left after 4 pm will be answered the following business day.   You may also send [place] a message via MyChart. We typically respond to MyChart messages within 1-2 business days.  For prescription refills, please ask your pharmacy to contact our office. Our fax number is [phone].  If you have an urgent issue when the clinic is closed that cannot wait until the next business day, you can page your doctor at the number below.    Please note that while we do our best to be available for urgent issues outside of office hours, we are not available 24/7.   If you have an urgent issue and are unable to reach [place], you may choose to seek medical care at your doctor's office, retail clinic, urgent care center, or emergency room.  If you have a medical emergency, please immediately call 911 or go to the emergency department.  Pager Numbers  - Dr. Gwen Pounds: [phone]  - Dr. Neale Burly: [phone]  - Dr. Roseanne Reno: [phone]  In the event of inclement weather, please call our main line at [phone] for an update on the status of any delays or closures.  Dermatology Medication Tips: Please keep the boxes that topical medications come in in order to help keep track of the instructions about where and how to use these. Pharmacies typically print the medication instructions only on the boxes and not directly on the medication tubes.   If your medication is too expensive, please contact our office at [phone] option 4 or send [place] a message through MyChart.   We are unable to tell what your co-pay for medications will be in advance as this is different depending on your insurance coverage. However, we may be able to find a substitute  medication at lower cost or fill out paperwork to get insurance to cover a needed medication.   If a prior authorization is required to get your medication covered by your insurance company, please allow [place] 1-2 business days to complete this process.  Drug prices often vary depending on where the prescription is filled and some pharmacies may offer cheaper prices.  The website www.goodrx.com contains coupons for medications through different pharmacies. The prices here do not account for what the cost may be with help from insurance (it may be cheaper with your insurance), but the website can give you the price if you did not use any insurance.  - You can print the associated coupon and take it with your prescription to the pharmacy.  - You may also stop by our office during regular business hours and pick up a GoodRx coupon card.  - If you need your prescription sent electronically to a different pharmacy, notify our office through [facility] or by phone at [phone] option 4.  Patient asked if he could have alcoholic drinks.  Advised 1-2 would be fine.

## 2020-12-26 NOTE — Progress Notes (Signed)
Isotretinoin Follow-Up Visit   Subjective  Thomas [name] is a 35 y.o. male who presents for the following: Hidradenitis (Pubic area, wk 32 Isotretinoin 40mg  2 po qd, pt happy with results).  Week # 32  Patient accompanied by caregiver.   Isotretinoin F/U - 12/26/20 1200       Isotretinoin Follow Up   iPledge # 12/28/20    Date 12/26/20    Weight 137 lb (62.1 kg)    Acne breakouts since last visit? No      Dosage   Target Dosage (mg) 12,420 mg    Current (To Date) Dosage (mg) 14,100 mg    To Go Dosage (mg) 0    To Go Dosage (mg)      Side Effects   Skin Chapped Lips    Gastrointestinal WNL    Neurological Dizziness;Headache    Constitutional Muscle/joint aches;Fatigue             Side effects: Dry skin, dry lips  Denies changes in night vision, shortness of breath, abdominal pain, nausea, vomiting, diarrhea, blood in stool or urine, visual changes, headaches, epistaxis, joint pain, myalgias, mood changes, depression, or suicidal ideation.   The following portions of the chart were reviewed this encounter and updated as appropriate: medications, allergies, medical history  Review of Systems:  No other skin or systemic complaints except as noted in HPI or Assessment and Plan.  Objective  Well appearing patient in no apparent distress; mood and affect are within normal limits.  An examination of the face, neck, chest, and back was performed and relevant findings are noted below.   Pubic Clear today per patient   Assessment & Plan   Hidradenitis suppurativa and acne Pubic; groin  Hidradenitis Suppurativa is a chronic; persistent; non-curable, but treatable condition due to abnormal inflamed sweat glands in the body folds (axilla, inframammary, groin, medial thighs), causing recurrent painful cysts and scarring. It can be associated with severe scarring acne and cysts; abscesses and scarring of scalp. The goal is control and prevention of flares, as it is not  curable. Scars are permanent and can be thickened. Treatment may include daily use of topical medication and oral antibiotics.  Oral isotretinoin may also be helpful.  For more severe cases, Humira (a biologic injection) may be prescribed to decrease the inflammatory process and prevent flares.  When indicated, inflamed cysts may also be treated surgically.  Severe; On Isotretinoin -  requiring FDA mandated monthly evaluations and laboratory monitoring; Chronic and Persistent; Not to Goal   Wk 32 Isotretinoin IPLEDGE 12/28/20 [facility] 09/04/20 Total mg = 14,100mg  Total mg/kg = 227mg /kg  Discusssed stopping today or continue for 1-2 more months Pt prefers to continue Isotretinoin  Cont Isotretinoin 40mg  2 po qd #60 0rf Pt confirmed in [facility] program  Patient asked if he could have alcoholic drinks.  Advised 1-2 would be fine as far as Isotretinoin goes but will need to discuss with other doctors to make sure ok with other medications.  Related Medications ISOtretinoin (ACCUTANE) 40 MG capsule Take 2 caps po QD with fatty meal   Xerosis secondary to isotretinoin therapy - Continue emollients as directed  Cheilitis secondary to isotretinoin therapy - Continue lip balm as directed, Dr. Cortibalm recommended  Long term medication management (isotretinoin) - While taking Isotretinoin and for 30 days after you finish the medication, do not share pills, do not donate blood. Isotretinoin is best absorbed when taken with a fatty meal. Isotretinoin can  make you sensitive to the sun. Daily careful sun protection including sunscreen SPF 30+ when outdoors is recommended.  Follow up 6 wks  I, Sonya Hupman, RMA, am acting as scribe for Armida Sans, MD . Documentation: I have reviewed the above documentation for accuracy and completeness, and I agree with the above.  Armida Sans, MD

## 2020-12-27 ENCOUNTER — Ambulatory Visit (INDEPENDENT_AMBULATORY_CARE_PROVIDER_SITE_OTHER): Payer: Medicaid Other | Admitting: Family Medicine

## 2020-12-27 ENCOUNTER — Other Ambulatory Visit: Payer: Self-pay

## 2020-12-27 ENCOUNTER — Encounter: Payer: Self-pay | Admitting: Family Medicine

## 2020-12-27 VITALS — BP 122/98 | HR 87 | Ht 63.0 in | Wt 145.0 lb

## 2020-12-27 DIAGNOSIS — R030 Elevated blood-pressure reading, without diagnosis of hypertension: Secondary | ICD-10-CM

## 2020-12-27 NOTE — Patient Instructions (Signed)
Your blood pressure looks good today. It was 122/98. The bottom number is a little high. Before I start any  new medicines I would like to further evaluate with a 24 hour home blood pressure monitor. I have you scheduled with our pharmacy team to have this done on July 14th at 11:15am. He will wear the monitor for 24 hours straight then come back in to have it removed.   Reach out to psychiatrist regarding psych meds and alcohol.

## 2020-12-27 NOTE — Assessment & Plan Note (Signed)
Multiple elevated blood pressure readings in office. Normotensive today. No prior history of HTN.  Refer to Dr. Raymondo Band for 24-hour ambulatory BP monitor.

## 2021-01-01 ENCOUNTER — Encounter: Payer: Self-pay | Admitting: Dermatology

## 2021-01-17 ENCOUNTER — Ambulatory Visit: Payer: Medicaid Other | Admitting: Pharmacist

## 2021-01-24 ENCOUNTER — Other Ambulatory Visit: Payer: Self-pay | Admitting: Dermatology

## 2021-01-24 DIAGNOSIS — L732 Hidradenitis suppurativa: Secondary | ICD-10-CM

## 2021-01-29 ENCOUNTER — Ambulatory Visit (INDEPENDENT_AMBULATORY_CARE_PROVIDER_SITE_OTHER): Payer: Medicaid Other | Admitting: Pharmacist

## 2021-01-29 ENCOUNTER — Other Ambulatory Visit: Payer: Self-pay

## 2021-01-29 DIAGNOSIS — I10 Essential (primary) hypertension: Secondary | ICD-10-CM

## 2021-01-29 NOTE — Patient Instructions (Addendum)
Nice to meet you and thank you for completing the blood pressure test.  Your test showed that you have elevated blood pressure readings of 159/109 during the day which is higher than our goal blood pressures of less than 130/80.   Start Amlodipine 2.5mg  once daily - please take that medication in the morning.  Please let us know if you have any lower leg swelling or any other side effect of concern.   Follow-up with Dr. Pecola Leisure in 4-6 weeks for blood pressure follow-up visit.

## 2021-01-29 NOTE — Progress Notes (Signed)
   S:    Patient arrives in good spirits, accompanied by his care assistant - Thomas [name] "Thomas [name]".    Presents to the clinic for ambulatory blood pressure evaluation.   Patient was referred and last seen by Primary Care Provider, Dr. Mauri Reading on 12/27/2020.  Patient has had multiple elevated readings of in office leading to concern for a hypertension diagnosis.   Medication compliance is reported to be excellent with help of care assistant.  Discussed procedure for wearing the monitor and gave patient written instructions. Monitor was placed on non-dominant (left) arm with instructions to return in the morning.   Current BP Medications include:  None  Antihypertensives tried in the past include: None   O:  Physical Exam Constitutional:      Appearance: Normal appearance.  Pulmonary:     Effort: Pulmonary effort is normal.  Neurological:     Mental Status: He is alert.    Review of Systems  All other systems reviewed and are negative.  Last 3 Office BP readings: BP Readings from Last 3 Encounters:  12/27/20 (!) 122/98  11/15/20 (!) 144/103  03/02/20 120/80    Basic Metabolic Panel    Component Value Date/Time   NA 146 (H) 09/04/2020 0927   K 4.2 09/04/2020 0927   CL 109 (H) 09/04/2020 0927   CO2 22 09/04/2020 0927   GLUCOSE 76 09/04/2020 0927   GLUCOSE 90 04/07/2016 1017   BUN 7 09/04/2020 0927   CREATININE 0.80 09/04/2020 0927   CREATININE 0.86 04/07/2016 1017   CALCIUM 9.6 09/04/2020 0927   GFRNONAA 113 03/13/2020 0935   GFRNONAA >89 04/07/2016 1017   GFRAA 130 03/13/2020 0935   GFRAA >89 04/07/2016 1017    ABPM Study Data: Arm Placement left arm  Overall Mean 24hr BP:   144/97 mmHg HR: 89  Daytime Mean BP:  159/109 mmHg HR: 100  Nighttime Mean BP:  126/83 mmHg HR: 77  Dipping Pattern: Yes.    Sys:   21%   Dia: 24.3%   [normal dipping ~10-20%]  Non-hypertensive ABPM thresholds: daytime BP <125/75 mmHg, sleeptime BP <120/70 mmHg    A/P: History of elevated  blood pressure readings on multiple occasions in the past. Patient found to hypertension on 24-hour ambulatory blood pressure evaluation.  Test demonstrates elevated awake readings with an average blood pressure of 159/109 mmHg while awake, and a nocturnal dipping pattern that is normal with readings while asleep of 126/83.  - Initiated amlodipine 2.5mg  tablet once daily in the AM. Patient and care provider educated on purpose, proper use and potential adverse effects of lower extremity edema.  Following instruction patient and care provider verbalized understanding of treatment plan.   Follow-up with new PCP, Dr. Pecola Leisure in 4-6 weeks for in-office BP check    Results reviewed and written information provided.  Total time in face-to-face counseling 23 minutes.

## 2021-01-30 ENCOUNTER — Encounter: Payer: Self-pay | Admitting: Pharmacist

## 2021-01-30 MED ORDER — AMLODIPINE BESYLATE 2.5 MG PO TABS: 2.5000 mg | ORAL_TABLET | Freq: Every day | ORAL | 2 refills | Status: DC

## 2021-01-30 NOTE — Progress Notes (Signed)
Reviewed: I agree with Dr. Koval's documentation and management. 

## 2021-01-30 NOTE — Assessment & Plan Note (Signed)
History of elevated blood pressure readings on multiple occasions in the past. Patient found to hypertension on 24-hour ambulatory blood pressure evaluation.  Test demonstrates elevated awake readings with an average blood pressure of 159/109 mmHg while awake, and a nocturnal dipping pattern that is normal with readings while asleep of 126/83.  - Initiated amlodipine 2.5mg  tablet once daily in the AM. Patient and care provider educated on purpose, proper use and potential adverse effects of lower extremity edema.  Following instruction patient and care provider verbalized understanding of treatment plan.

## 2021-02-06 ENCOUNTER — Other Ambulatory Visit: Payer: Self-pay | Admitting: Dermatology

## 2021-02-06 ENCOUNTER — Encounter: Payer: Self-pay | Admitting: Dermatology

## 2021-02-06 ENCOUNTER — Ambulatory Visit (INDEPENDENT_AMBULATORY_CARE_PROVIDER_SITE_OTHER): Payer: Medicaid Other | Admitting: Dermatology

## 2021-02-06 VITALS — Wt 148.0 lb

## 2021-02-06 DIAGNOSIS — L853 Xerosis cutis: Secondary | ICD-10-CM | POA: Diagnosis not present

## 2021-02-06 DIAGNOSIS — L732 Hidradenitis suppurativa: Secondary | ICD-10-CM | POA: Diagnosis not present

## 2021-02-06 DIAGNOSIS — Z79899 Other long term (current) drug therapy: Secondary | ICD-10-CM | POA: Diagnosis not present

## 2021-02-06 DIAGNOSIS — K130 Diseases of lips: Secondary | ICD-10-CM | POA: Diagnosis not present

## 2021-02-06 NOTE — Progress Notes (Signed)
   Isotretinoin Follow-Up Visit   Subjective  Thomas [name] is a 35 y.o. male who presents for the following: HS (Isotretinoin week 38 - patient currently taking 40mg  2 po QD. Patient c/o xerosis but no other side effects.).  Week # 38   Isotretinoin F/U - 02/06/21 1200       Isotretinoin Follow Up   iPledge # 04/08/21    Date 02/06/21    Weight 148 lb (67.1 kg)    Acne breakouts since last visit? No      Dosage   Target Dosage (mg) 13,420    Current (To Date) Dosage (mg) 16,500    To Go Dosage (mg) Over goal dose      Side Effects   Skin Chapped Lips;Dry Lips;Dry Skin    Gastrointestinal WNL    Neurological WNL    Constitutional WNL             Side effects: Dry skin, dry lips  Denies changes in night vision, shortness of breath, abdominal pain, nausea, vomiting, diarrhea, blood in stool or urine, visual changes, headaches, epistaxis, joint pain, myalgias, mood changes, depression, or suicidal ideation.   The following portions of the chart were reviewed this encounter and updated as appropriate: medications, allergies, medical history  Review of Systems:  No other skin or systemic complaints except as noted in HPI or Assessment and Plan.  Objective  Well appearing patient in no apparent distress; mood and affect are within normal limits.  An examination of the face, neck, chest, and back was performed and relevant findings are noted below.    Assessment & Plan   Hidradenitis suppurativa and Acne Groin; face Severe; On Isotretinoin -  requiring FDA mandated monthly evaluations and laboratory monitoring; Chronic and Persistent; Not to Goal  Hidradenitis Suppurativa is a chronic; persistent; non-curable, but treatable condition due to abnormal inflamed sweat glands in the body folds (axilla, inframammary, groin, medial thighs), causing recurrent painful cysts and scarring. It can be associated with severe scarring acne and cysts; abscesses and scarring of scalp.  The goal is control and prevention of flares, as it is not curable. Scars are permanent and can be thickened. Treatment may include daily use of topical medication and oral antibiotics.  Oral isotretinoin may also be helpful.  For more severe cases, Humira (a biologic injection) may be prescribed to decrease the inflammatory process and prevent flares.  When indicated, inflamed cysts may also be treated surgically.  Total dose to date 244.4mg /kg  04/08/21 what is left of Isotretinoin 40mg  2 po QD then course of treatment is over. Recheck at follow up appointment.    Xerosis secondary to isotretinoin therapy - Continue emollients as directed  Cheilitis secondary to isotretinoin therapy - Continue lip balm as directed, Dr. Cortibalm recommended  Long term medication management (isotretinoin) - While taking Isotretinoin and for 30 days after you finish the medication, do not share pills, do not donate blood. Isotretinoin is best absorbed when taken with a fatty meal. Isotretinoin can make you sensitive to the sun. Daily careful sun protection including sunscreen SPF 30+ when outdoors is recommended.  Return for follow up in 4-6 months   I, Doreatha Martin, CMA, am acting as scribe for , MD .  Documentation: I have reviewed the above documentation for accuracy and completeness, and I agree with the above.  Clayborne Artist, MD

## 2021-02-06 NOTE — Patient Instructions (Signed)

## 2021-03-11 NOTE — Progress Notes (Addendum)
    SUBJECTIVE:   CHIEF COMPLAINT / HPI:   Blood Pressure F/U Patient presents today for follow up on blood pressure. He was seen by Dr. Raymondo Band for ambulatory blood pressure monitoring on 7/26. Upon review, he had an elevated awake reading average of 159/109 and was started on Amlodipine 2.5 mg once daily. He presents today for f/u since starting the medication. He is here today with his caretaker who reports daily compliance. He is not experiencing any adverse side effects from the medication. Denies chest pain, shortness of breath, visual changes, and headaches.  PERTINENT  PMH / PSH:  Past Medical History:  Diagnosis Date   Acne    Anxiety    Impulse control disorder    IMPULSE CONTROL DISORDER 08/29/2009   Qualifier: Diagnosis of  By: Denyse Amass MD, Evan     Mental retardation, mild (I.Q. 50-70)    MENTAL RETARDATION, MODERATE 08/29/2009   Qualifier: Diagnosis of  By: Denyse Amass MD, Clayburn Pert     Schizo-affective psychosis (HCC)    Ulcer of foot (HCC)      OBJECTIVE:   Vitals:   03/12/21 1331 03/12/21 1636  BP: (!) 139/103 (!) 132/92  Pulse: 92 94  SpO2: 99%   Weight: 152 lb 9.6 oz (69.2 kg)      General: NAD, pleasant, able to participate in exam, caretaker present Cardiac: RRR, no murmurs. Respiratory: CTAB, normal effort, No wheezes, rales or rhonchi Extremities: no edema or cyanosis. Skin: warm and dry, no rashes noted Psych: Normal affect and mood   ASSESSMENT/PLAN:   Hypertension Blood pressure continues to be elevated despite starting amlodipine.  Has been compliant with his medication.  Admits that his diet is not the best and likely high insults.  We discussed lifestyle recommendations including a low-salt diet and incorporating more exercise.  We discussed smoking cessation as well, he is interested in this.  Patient did smoke a cigarette prior to coming in today, could be contributing to his elevated blood pressure. -Increase amlodipine to 5 mg daily (sent another  prescription for 2 and half milligrams given that he receives bubble packs and has already received his month supply.  Can send in 5 mg prescription next month.) -Follow-up in 1 month for BP check  Tobacco use disorder Reports smoking 8 cigarettes a day.  States he is thinking about cutting back. -Discussed different tobacco cessation options -Provided 1-800-QUIT-NOW hotline      Sabino Dick, DO [facility]

## 2021-03-12 ENCOUNTER — Encounter: Payer: Self-pay | Admitting: Family Medicine

## 2021-03-12 ENCOUNTER — Ambulatory Visit (INDEPENDENT_AMBULATORY_CARE_PROVIDER_SITE_OTHER): Payer: Medicaid Other | Admitting: Family Medicine

## 2021-03-12 ENCOUNTER — Other Ambulatory Visit: Payer: Self-pay

## 2021-03-12 DIAGNOSIS — I10 Essential (primary) hypertension: Secondary | ICD-10-CM

## 2021-03-12 DIAGNOSIS — F17200 Nicotine dependence, unspecified, uncomplicated: Secondary | ICD-10-CM

## 2021-03-12 MED ORDER — BLOOD PRESSURE KIT: 1.0000 | PACK | Freq: Every day | 0 refills | Status: DC

## 2021-03-12 MED ORDER — AMLODIPINE BESYLATE 2.5 MG PO TABS: 2.5000 mg | ORAL_TABLET | Freq: Every day | ORAL | 0 refills | Status: DC

## 2021-03-12 NOTE — Patient Instructions (Addendum)
It was wonderful to meet you today.  Please bring ALL of your medications with you to every visit.   Today we talked about:  -We are increasing your blood pressure medication to 5 mg daily.  -You can call 1-800-QUIT-NOW for help with cutting down on smoking.  -Below is information on a low-salt diet.   Thank you for choosing [facility].   Please call [phone] with any questions about today's appointment.  Please be sure to schedule follow up at the front  desk before you leave today.   Sabino Dick, DO PGY-2 Family Medicine   DASH Eating Plan DASH stands for Dietary Approaches to Stop Hypertension. The DASH eating plan is a healthy eating plan that has been shown to: Reduce high blood pressure (hypertension). Reduce your risk for type 2 diabetes, heart disease, and stroke. Help with weight loss. What are tips for following this plan? Reading food labels Check food labels for the amount of salt (sodium) per serving. Choose foods with less than 5 percent of the Daily Value of sodium. Generally, foods with less than 300 milligrams (mg) of sodium per serving fit into this eating plan. To find whole grains, look for the word "whole" as the first word in the ingredient list. Shopping Buy products labeled as "low-sodium" or "no salt added." Buy fresh foods. Avoid canned foods and pre-made or frozen meals. Cooking Avoid adding salt when cooking. Use salt-free seasonings or herbs instead of table salt or sea salt. Check with your health care provider or pharmacist before using salt substitutes. Do not fry foods. Cook foods using healthy methods such as baking, boiling, grilling, roasting, and broiling instead. Cook with heart-healthy oils, such as olive, canola, avocado, soybean, or sunflower oil. Meal planning  Eat a balanced diet that includes: 4 or more servings of fruits and 4 or more servings of vegetables each day. Try to fill one-half of your plate with  fruits and vegetables. 6-8 servings of whole grains each day. Less than 6 oz (170 g) of lean meat, poultry, or fish each day. A 3-oz (85-g) serving of meat is about the same size as a deck of cards. One egg equals 1 oz (28 g). 2-3 servings of low-fat dairy each day. One serving is 1 cup (237 mL). 1 serving of nuts, seeds, or beans 5 times each week. 2-3 servings of heart-healthy fats. Healthy fats called omega-3 fatty acids are found in foods such as walnuts, flaxseeds, fortified milks, and eggs. These fats are also found in cold-water fish, such as sardines, salmon, and mackerel. Limit how much you eat of: Canned or prepackaged foods. Food that is high in trans fat, such as some fried foods. Food that is high in saturated fat, such as fatty meat. Desserts and other sweets, sugary drinks, and other foods with added sugar. Full-fat dairy products. Do not salt foods before eating. Do not eat more than 4 egg yolks a week. Try to eat at least 2 vegetarian meals a week. Eat more home-cooked food and less restaurant, buffet, and fast food. Lifestyle When eating at a restaurant, ask that your food be prepared with less salt or no salt, if possible. If you drink alcohol: Limit how much you use to: 0-1 drink a day for women who are not pregnant. 0-2 drinks a day for men. Be aware of how much alcohol is in your drink. In the [place] one drink equals one 12 oz bottle of beer (355 mL), one 5 oz  glass of wine (148 mL), or one 1 oz glass of hard liquor (44 mL). General information Avoid eating more than 2,300 mg of salt a day. If you have hypertension, you may need to reduce your sodium intake to 1,500 mg a day. Work with your health care provider to maintain a healthy body weight or to lose weight. Ask what an ideal weight is for you. Get at least 30 minutes of exercise that causes your heart to beat faster (aerobic exercise) most days of the week. Activities may include walking, swimming, or  biking. Work with your health care provider or dietitian to adjust your eating plan to your individual calorie needs. What foods should I eat? Fruits All fresh, dried, or frozen fruit. Canned fruit in natural juice (without added sugar). Vegetables Fresh or frozen vegetables (raw, steamed, roasted, or grilled). Low-sodium or reduced-sodium tomato and vegetable juice. Low-sodium or reduced-sodium tomato sauce and tomato paste. Low-sodium or reduced-sodium canned vegetables. Grains Whole-grain or whole-wheat bread. Whole-grain or whole-wheat pasta. Brown rice. Orpah Cobb. Bulgur. Whole-grain and low-sodium cereals. Pita bread. Low-fat, low-sodium crackers. Whole-wheat flour tortillas. Meats and other proteins Skinless chicken or [place]. Ground chicken or [place]. Pork with fat trimmed off. Fish and seafood. Egg whites. Dried beans, peas, or lentils. Unsalted nuts, nut butters, and seeds. Unsalted canned beans. Lean cuts of beef with fat trimmed off. Low-sodium, lean precooked or cured meat, such as sausages or meat loaves. Dairy Low-fat (1%) or fat-free (skim) milk. Reduced-fat, low-fat, or fat-free cheeses. Nonfat, low-sodium ricotta or cottage cheese. Low-fat or nonfat yogurt. Low-fat, low-sodium cheese. Fats and oils Soft margarine without trans fats. Vegetable oil. Reduced-fat, low-fat, or light mayonnaise and salad dressings (reduced-sodium). Canola, safflower, olive, avocado, soybean, and sunflower oils. Avocado. Seasonings and condiments Herbs. Spices. Seasoning mixes without salt. Other foods Unsalted popcorn and pretzels. Fat-free sweets. The items listed above may not be a complete list of foods and beverages you can eat. Contact a dietitian for more information. What foods should I avoid? Fruits Canned fruit in a light or heavy syrup. Fried fruit. Fruit in cream or butter sauce. Vegetables Creamed or fried vegetables. Vegetables in a cheese sauce. Regular canned vegetables (not  low-sodium or reduced-sodium). Regular canned tomato sauce and paste (not low-sodium or reduced-sodium). Regular tomato and vegetable juice (not low-sodium or reduced-sodium). Rosita Fire. Olives. Grains Baked goods made with fat, such as croissants, muffins, or some breads. Dry pasta or rice meal packs. Meats and other proteins Fatty cuts of meat. Ribs. Fried meat. Tomasa Blase. Bologna, salami, and other precooked or cured meats, such as sausages or meat loaves. Fat from the back of a pig (fatback). Bratwurst. Salted nuts and seeds. Canned beans with added salt. Canned or smoked fish. Whole eggs or egg yolks. Chicken or [place] with skin. Dairy Whole or 2% milk, cream, and half-and-half. Whole or full-fat cream cheese. Whole-fat or sweetened yogurt. Full-fat cheese. Nondairy creamers. Whipped toppings. Processed cheese and cheese spreads. Fats and oils Butter. Stick margarine. Lard. Shortening. Ghee. Bacon fat. Tropical oils, such as coconut, palm kernel, or palm oil. Seasonings and condiments Onion salt, garlic salt, seasoned salt, table salt, and sea salt. Worcestershire sauce. Tartar sauce. Barbecue sauce. Teriyaki sauce. Soy sauce, including reduced-sodium. Steak sauce. Canned and packaged gravies. Fish sauce. Oyster sauce. Cocktail sauce. Store-bought horseradish. Ketchup. Mustard. Meat flavorings and tenderizers. Bouillon cubes. Hot sauces. Pre-made or packaged marinades. Pre-made or packaged taco seasonings. Relishes. Regular salad dressings. Other foods Salted popcorn and pretzels. The items listed above  may not be a complete list of foods and beverages you should avoid. Contact a dietitian for more information. Where to find more information National Heart, Lung, and Blood Institute: PopSteam.is American Heart Association: www.heart.org Academy of Nutrition and Dietetics: www.eatright.org National Kidney Foundation: www.kidney.org Summary The DASH eating plan is a healthy eating plan that  has been shown to reduce high blood pressure (hypertension). It may also reduce your risk for type 2 diabetes, heart disease, and stroke. When on the DASH eating plan, aim to eat more fresh fruits and vegetables, whole grains, lean proteins, low-fat dairy, and heart-healthy fats. With the DASH eating plan, you should limit salt (sodium) intake to 2,300 mg a day. If you have hypertension, you may need to reduce your sodium intake to 1,500 mg a day. Work with your health care provider or dietitian to adjust your eating plan to your individual calorie needs. This information is not intended to replace advice given to you by your health care provider. Make sure you discuss any questions you have with your health care provider. Document Revised: 05/27/2019 Document Reviewed: 05/27/2019 Elsevier Patient Education  2022 [employer].

## 2021-03-12 NOTE — Assessment & Plan Note (Signed)
Reports smoking 8 cigarettes a day.  States he is thinking about cutting back. -Discussed different tobacco cessation options -Provided 1-800-QUIT-NOW hotline

## 2021-03-12 NOTE — Assessment & Plan Note (Signed)
Blood pressure continues to be elevated despite starting amlodipine.  Has been compliant with his medication.  Admits that his diet is not the best and likely high insults.  We discussed lifestyle recommendations including a low-salt diet and incorporating more exercise.  We discussed smoking cessation as well, he is interested in this.  Patient did smoke a cigarette prior to coming in today, could be contributing to his elevated blood pressure. -Increase amlodipine to 5 mg daily (sent another prescription for 2 and half milligrams given that he receives bubble packs and has already received his month supply.  Can send in 5 mg prescription next month.) -Follow-up in 1 month for BP check

## 2021-03-14 ENCOUNTER — Other Ambulatory Visit: Payer: Self-pay

## 2021-03-15 MED ORDER — AMLODIPINE BESYLATE 5 MG PO TABS: 5.0000 mg | ORAL_TABLET | Freq: Every day | ORAL | 1 refills | Status: DC

## 2021-04-08 NOTE — Progress Notes (Deleted)
    SUBJECTIVE:   CHIEF COMPLAINT / HPI:   Blood Pressure F/U Patient presents today for follow up on BP. He was last seen on 9/6 and his Amlodipine was increased from 2.5 mg to 5 mg. He has been compliant with his medication.*** He checks his BP at home and it ranges ***. He denies chest pain, SOB, headache, leg edema and visual changes.***   PERTINENT  PMH / PSH:  Past Medical History:  Diagnosis Date   Acne    Anxiety    Impulse control disorder    IMPULSE CONTROL DISORDER 08/29/2009   Qualifier: Diagnosis of  By: Denyse Amass MD, Evan     Mental retardation, mild (I.Q. 50-70)    MENTAL RETARDATION, MODERATE 08/29/2009   Qualifier: Diagnosis of  By: Denyse Amass MD, Clayburn Pert     Schizo-affective psychosis (HCC)    Ulcer of foot (HCC)      OBJECTIVE:   There were no vitals taken for this visit. ***  General: NAD, pleasant, able to participate in exam Cardiac: RRR, no murmurs. Respiratory: CTAB, normal effort, No wheezes, rales or rhonchi Abdomen: Bowel sounds present, nontender, nondistended, no hepatosplenomegaly. Extremities: no edema or cyanosis. Skin: warm and dry, no rashes noted Neuro: alert, no obvious focal deficits Psych: Normal affect and mood  ASSESSMENT/PLAN:   No problem-specific Assessment & Plan notes found for this encounter.     Sabino Dick, DO [facility]

## 2021-04-09 ENCOUNTER — Ambulatory Visit: Payer: Medicaid Other | Admitting: Family Medicine

## 2021-04-29 ENCOUNTER — Other Ambulatory Visit: Payer: Self-pay

## 2021-04-29 ENCOUNTER — Ambulatory Visit (INDEPENDENT_AMBULATORY_CARE_PROVIDER_SITE_OTHER): Payer: Medicaid Other

## 2021-04-29 ENCOUNTER — Ambulatory Visit (INDEPENDENT_AMBULATORY_CARE_PROVIDER_SITE_OTHER): Payer: Medicaid Other | Admitting: Family Medicine

## 2021-04-29 VITALS — BP 146/105 | HR 95 | Wt 154.6 lb

## 2021-04-29 DIAGNOSIS — I10 Essential (primary) hypertension: Secondary | ICD-10-CM

## 2021-04-29 DIAGNOSIS — Z23 Encounter for immunization: Secondary | ICD-10-CM

## 2021-04-29 MED ORDER — AMLODIPINE BESYLATE 5 MG PO TABS: 10.0000 mg | ORAL_TABLET | Freq: Every day | ORAL | 0 refills | Status: DC

## 2021-04-29 NOTE — Patient Instructions (Signed)
Your blood pressure continues to be elevated. We increased your blood pressure medication to 10 mg daily. Follow up in 2 weeks for a blood pressure check.   Dr. Salvadore Dom

## 2021-04-29 NOTE — Progress Notes (Signed)
    SUBJECTIVE:   CHIEF COMPLAINT / HPI:   Mr. Ambrosius presents for follow up for the below.   Hypertension: - Medications: amlodipine 5 mg daily  - Compliance: Yes - Denies any SOB, CP, vision changes, LE edema, medication SEs, or symptoms of hypotension  PERTINENT  PMH / PSH: Schizoaffective disorder, tobacco use disorder  OBJECTIVE:   BP (!) 146/105   Pulse 95   Wt 154 lb 9.6 oz (70.1 kg)   SpO2 100%   BMI 27.39 kg/m   General: Appears well, no acute distress. Age appropriate. Cardiac: RRR, normal heart sounds, no murmurs Respiratory: CTAB, normal effort   ASSESSMENT/PLAN:    1. Primary hypertension Uncontrolled. Asymptomatic. Will max out amlodipine today and follow up in 2 weeks for BP recheck. Consider adding another agent for better control. Continues to smoke cigarettes. No ready to quit. Aware that this has a negative effect on BP.  - amLODipine (NORVASC) 5 MG tablet; Take 2 tablets (10 mg total) by mouth at bedtime.  Dispense: 30 tablet; Refill: 0    Lavonda Jumbo, DO [place] [facility]

## 2021-05-09 ENCOUNTER — Other Ambulatory Visit: Payer: Self-pay | Admitting: Family Medicine

## 2021-05-09 DIAGNOSIS — I10 Essential (primary) hypertension: Secondary | ICD-10-CM

## 2021-05-17 NOTE — Progress Notes (Signed)
    SUBJECTIVE:   CHIEF COMPLAINT / HPI:   Blood Pressure F/U Thomas [name] is a 35 y.o. male who presents to the clinic today for follow up on her blood pressure. Current medications include amlodipine 10 mg. Reports good compliance. Does not check home blood pressure. Diet is varied but he has cut down on [place] food. Exercise- has played football and basketball a few times. Denies chest pain, shortness of breath, lower extremity edema, headaches, and vision changes.    PERTINENT  PMH / PSH:  Past Medical History:  Diagnosis Date   Acne    Anxiety    Impulse control disorder    IMPULSE CONTROL DISORDER 08/29/2009   Qualifier: Diagnosis of  By: Denyse Amass MD, Evan     Mental retardation, mild (I.Q. 50-70)    MENTAL RETARDATION, MODERATE 08/29/2009   Qualifier: Diagnosis of  By: Denyse Amass MD, [place] psychosis (HCC)    Ulcer of foot (HCC)     OBJECTIVE:   BP 122/80   Pulse 94   Ht 5\' 3"  (1.6 m)   Wt 154 lb 9.6 oz (70.1 kg)   SpO2 100%   BMI 27.39 kg/m    General: NAD, pleasant, able to participate in exam Cardiac: RRR, no murmurs. Respiratory: CTAB, normal effort, No wheezes, rales or rhonchi Extremities: no edema or cyanosis. Skin: warm and dry, no rashes noted Neuro: alert, no obvious focal deficits Psych: Normal affect and mood  ASSESSMENT/PLAN:   Hypertension Improved and controlled on current regimen of amlodipine 10 mg daily.  He is without any side effects.  We again discussed improving exercise and diet regimen to help his overall health. Can likely titrate off of his medications in the future if he is able to make these lifestyle modifications. -Continue amlodipine 10 mg daily; refill placed today -Given that he is stable, can follow-up in 6 months      , DO [facility]

## 2021-05-20 ENCOUNTER — Other Ambulatory Visit: Payer: Self-pay

## 2021-05-20 ENCOUNTER — Encounter: Payer: Self-pay | Admitting: Family Medicine

## 2021-05-20 ENCOUNTER — Ambulatory Visit (INDEPENDENT_AMBULATORY_CARE_PROVIDER_SITE_OTHER): Payer: Medicaid Other | Admitting: Family Medicine

## 2021-05-20 DIAGNOSIS — I10 Essential (primary) hypertension: Secondary | ICD-10-CM

## 2021-05-20 MED ORDER — AMLODIPINE BESYLATE 10 MG PO TABS: 10.0000 mg | ORAL_TABLET | Freq: Every day | ORAL | 3 refills | Status: DC

## 2021-05-20 NOTE — Assessment & Plan Note (Signed)
Improved and controlled on current regimen of amlodipine 10 mg daily.  He is without any side effects.  We again discussed improving exercise and diet regimen to help his overall health. Can likely titrate off of his medications in the future if he is able to make these lifestyle modifications. -Continue amlodipine 10 mg daily; refill placed today -Given that he is stable, can follow-up in 6 months

## 2021-05-20 NOTE — Patient Instructions (Signed)
It was wonderful to see you today.  Today we talked about:  -Your blood pressure was improved today! -Continue taking your Amlodipine 10 mg daily. -Continue your efforts to increase your exercise! Continue working on your diet- try to decrease the amount of salt in your diet and increase the vegetables.    Thank you for choosing [facility].   Please call [phone] with any questions about today's appointment.  Please be sure to schedule follow up at the front  desk before you leave today.   Sabino Dick, DO PGY-2 Family Medicine

## 2021-07-10 ENCOUNTER — Other Ambulatory Visit: Payer: Self-pay

## 2021-07-10 ENCOUNTER — Ambulatory Visit (INDEPENDENT_AMBULATORY_CARE_PROVIDER_SITE_OTHER): Payer: Medicaid Other | Admitting: Dermatology

## 2021-07-10 DIAGNOSIS — L732 Hidradenitis suppurativa: Secondary | ICD-10-CM | POA: Diagnosis not present

## 2021-07-10 DIAGNOSIS — L299 Pruritus, unspecified: Secondary | ICD-10-CM | POA: Diagnosis not present

## 2021-07-10 DIAGNOSIS — S30810A Abrasion of lower back and pelvis, initial encounter: Secondary | ICD-10-CM

## 2021-07-10 DIAGNOSIS — L709 Acne, unspecified: Secondary | ICD-10-CM

## 2021-07-10 NOTE — Patient Instructions (Addendum)
Recommend using [employer] daily, leave on for 1-2 minutes before rinsing off. This can be purchased at [employer] or online.   Recommend Cln facial moisturizer a apply to skin buttock after washing     If You Need Anything After Your Visit  If you have any questions or concerns for your doctor, please call our main line at [phone] and press option 4 to reach your doctor's medical assistant. If no one answers, please leave a voicemail as directed and we will return your call as soon as possible. Messages left after 4 pm will be answered the following business day.   You may also send [place] a message via [place]. We typically respond to MyChart messages within 1-2 business days.  For prescription refills, please ask your pharmacy to contact our office. Our fax number is [phone].  If you have an urgent issue when the clinic is closed that cannot wait until the next business day, you can page your doctor at the number below.    Please note that while we do our best to be available for urgent issues outside of office hours, we are not available 24/7.   If you have an urgent issue and are unable to reach [place], you may choose to seek medical care at your doctor's office, retail clinic, urgent care center, or emergency room.  If you have a medical emergency, please immediately call 911 or go to the emergency department.  Pager Numbers  - Dr. Nehemiah Massed: [phone]  - Dr. Laurence Ferrari: [phone]  - Dr. Nicole Kindred: [phone]  In the event of inclement weather, please call our main line at [phone] for an update on the status of any delays or closures.  Dermatology Medication Tips: Please keep the boxes that topical medications come in in order to help keep track of the instructions about where and how to use these. Pharmacies typically print the medication instructions only on the boxes and not directly on the medication tubes.   If your medication is too expensive, please  contact our office at [phone] option 4 or send [place] a message through [place].   We are unable to tell what your co-pay for medications will be in advance as this is different depending on your insurance coverage. However, we may be able to find a substitute medication at lower cost or fill out paperwork to get insurance to cover a needed medication.   If a prior authorization is required to get your medication covered by your insurance company, please allow [place] 1-2 business days to complete this process.  Drug prices often vary depending on where the prescription is filled and some pharmacies may offer cheaper prices.  The website www.goodrx.com contains coupons for medications through different pharmacies. The prices here do not account for what the cost may be with help from insurance (it may be cheaper with your insurance), but the website can give you the price if you did not use any insurance.  - You can print the associated coupon and take it with your prescription to the pharmacy.  - You may also stop by our office during regular business hours and pick up a GoodRx coupon card.  - If you need your prescription sent electronically to a different pharmacy, notify our office through [facility] or by phone at [phone] option 4.     Si Usted Necesita Algo Despus de Su Visita  Tambin puede enviarnos un mensaje a travs de Pharmacist, community. Por lo general respondemos a  los mensajes de MyChart en el transcurso de 1 a 2 das hbiles.  Para renovar recetas, por favor pida a su farmacia que se ponga en contacto con nuestra oficina. Harland Dingwall de fax es [place] [phone].  Si tiene un asunto urgente cuando la clnica est cerrada y que no puede esperar hasta el siguiente da hbil, puede llamar/localizar a su doctor(a) al nmero que aparece a continuacin.   Por favor, tenga en cuenta que aunque hacemos todo lo posible para estar disponibles para asuntos urgentes fuera del horario de  [place], no estamos disponibles las 24 horas del da, los 7 das de la [place].   Si tiene un problema urgente y no puede comunicarse con nosotros, puede optar por buscar atencin mdica  en el consultorio de su doctor(a), en una clnica privada, en un centro de atencin urgente o en una sala de emergencias.  Si tiene Engineering geologist, por favor llame inmediatamente al 911 o vaya a la sala de emergencias.  Nmeros de bper  - Dr. Nehemiah Massed: [phone]  - Dra. Moye: [phone]  - Dra. Nicole Kindred: [phone]  En caso de inclemencias del [place], por favor llame a Johnsie Kindred principal al [phone] para una actualizacin sobre el [place] de cualquier retraso o cierre.  Consejos para la medicacin en dermatologa: Por favor, guarde las cajas en las que vienen los medicamentos de uso tpico para ayudarle a seguir las instrucciones sobre dnde y cmo usarlos. Las farmacias generalmente imprimen las instrucciones del medicamento slo en las cajas y no directamente en los tubos del [place].   Si su medicamento es muy caro, por favor, pngase en contacto con Zigmund Daniel llamando al [phone] y presione la opcin 4 o envenos un mensaje a travs de Pharmacist, community.   No podemos decirle cul ser su copago por los medicamentos por adelantado ya que esto es diferente dependiendo de la cobertura de su seguro. Sin embargo, es posible que podamos encontrar un medicamento sustituto a Electrical engineer un formulario para que el seguro cubra el medicamento que se considera necesario.   Si se requiere una autorizacin previa para que su compaa de seguros [place] su medicamento, por favor permtanos de 1 a 2 das hbiles para completar este proceso.  Los precios de los medicamentos varan con frecuencia dependiendo del Environmental consultant de dnde se surte la receta y alguna farmacias pueden ofrecer precios ms baratos.  El sitio web www.goodrx.com tiene cupones para medicamentos de Airline pilot. Los  precios aqu no tienen en cuenta lo que podra costar con la ayuda del seguro (puede ser ms barato con su seguro), pero el sitio web puede darle el precio si no utiliz Research scientist (physical sciences).  - Puede imprimir el cupn correspondiente y llevarlo con su receta a la farmacia.  - Tambin puede pasar por nuestra oficina durante el horario de atencin regular y Charity fundraiser una tarjeta de cupones de GoodRx.  - Si necesita que su receta se enve electrnicamente a una farmacia diferente, informe a nuestra oficina a travs de MyChart de [place] o por telfono llamando al [phone] y presione la opcin 4.

## 2021-07-10 NOTE — Progress Notes (Signed)
° °  Follow-Up Visit   Subjective  Hassel Uphoff is a 36 y.o. male who presents for the following: Follow-up (4 months f/u on Hidradenitis suppurativa, pt finished Isotretinoin therapy 4 months ago with a good response ). The patient and his caregiver both report that he has improved with isotretinoin treatment and he no longer has active boils or drainage.  He does complain of itching on his buttocks skin. Care giver with patient who contributes to history.  The following portions of the chart were reviewed this encounter and updated as appropriate:   Tobacco   Allergies   Meds   Problems   Med Hx   Surg Hx   Fam Hx      Review of Systems:  No other skin or systemic complaints except as noted in HPI or Assessment and Plan.  Objective  Well appearing patient in no apparent distress; mood and affect are within normal limits.  A focused examination was performed including buttock. Relevant physical exam findings are noted in the Assessment and Plan.  Gluteal Crease Scarring and excoriations scarred, no active areas or drainage          Assessment & Plan  Hidradenitis suppurativa and acne -much improved after isotretinoin course, now without active boils or drainage in the buttocks and groin areas.  His acne is much better too. Face buttocks and groin  Hidradenitis Suppurativa is a chronic; persistent; non-curable, but treatable condition due to abnormal inflamed sweat glands in the body folds (axilla, inframammary, groin, medial thighs), causing recurrent painful draining cysts and scarring. It can be associated with severe scarring acne and cysts; also abscesses and scarring of scalp. The goal is control and prevention of flares, as it is not curable. Scars are permanent and can be thickened. Treatment may include daily use of topical medication and oral antibiotics.  Oral isotretinoin may also be helpful.  For more severe cases, Humira (a biologic injection) may be prescribed to  decrease the inflammatory process and prevent flares.  When indicated, inflamed cysts may also be treated surgically.   Patient is doing much better and therefore no longer needs any further treatment at this time with isotretinoin or Humira injections.  Itching of the buttocks with excoriations Recommend using [employer] daily, leave on for 1-2 minutes before rinsing off. This can be purchased at [employer] or online.   Recommend Cln facial moisturizer apply to buttock daily after cleansing with Cln sports wash   Return in about 2 months (around 09/07/2021) for HS .  IAngelique Holm, CMA, am acting as scribe for Armida Sans, MD .  Documentation: I have reviewed the above documentation for accuracy and completeness, and I agree with the above.  Armida Sans, MD

## 2021-07-11 ENCOUNTER — Encounter: Payer: Self-pay | Admitting: Dermatology

## 2021-09-11 ENCOUNTER — Ambulatory Visit (INDEPENDENT_AMBULATORY_CARE_PROVIDER_SITE_OTHER): Payer: Medicaid Other | Admitting: Dermatology

## 2021-09-11 ENCOUNTER — Other Ambulatory Visit: Payer: Self-pay

## 2021-09-11 DIAGNOSIS — L700 Acne vulgaris: Secondary | ICD-10-CM | POA: Diagnosis not present

## 2021-09-11 DIAGNOSIS — L905 Scar conditions and fibrosis of skin: Secondary | ICD-10-CM | POA: Diagnosis not present

## 2021-09-11 DIAGNOSIS — L299 Pruritus, unspecified: Secondary | ICD-10-CM | POA: Diagnosis not present

## 2021-09-11 DIAGNOSIS — L732 Hidradenitis suppurativa: Secondary | ICD-10-CM | POA: Diagnosis not present

## 2021-09-11 NOTE — Patient Instructions (Signed)

## 2021-09-11 NOTE — Progress Notes (Unsigned)
° °  Follow-Up Visit   Subjective  Thomas [name] is a 36 y.o. male who presents for the following: Hidradenitis (Face, buttocks, groin, no txt at this time, was improved at last visit) and Pruritis of the buttocks with excoriations (Buttocks, Cln sports wash qd, Cln facial moisturizer qd).  Patient accompanied by caregiver.  The following portions of the chart were reviewed this encounter and updated as appropriate:       Review of Systems:  No other skin or systemic complaints except as noted in HPI or Assessment and Plan.  Objective  Well appearing patient in no apparent distress; mood and affect are within normal limits.  A focused examination was performed including face, buttocks, groin. Relevant physical exam findings are noted in the Assessment and Plan.  face Scarring face, no active paps  buttocks Clear today per patient    Assessment & Plan  Hidradenitis suppurativa buttocks, groin  Much improved 63m s/p Isotretinoin course, now without active boils or drainage in buttocks and groin area.  Hidradenitis Suppurativa is a chronic; persistent; non-curable, but treatable condition due to abnormal inflamed sweat glands in the body folds (axilla, inframammary, groin, medial thighs), causing recurrent painful draining cysts and scarring. It can be associated with severe scarring acne and cysts; also abscesses and scarring of scalp. The goal is control and prevention of flares, as it is not curable. Scars are permanent and can be thickened. Treatment may include daily use of topical medication and oral antibiotics.  Oral isotretinoin may also be helpful.  For more severe cases, Humira (a biologic injection) may be prescribed to decrease the inflammatory process and prevent flares.  When indicated, inflamed cysts may also be treated surgically.  Patient is doing much better and therefore no longer needs any further treatment at this time.  Acne vulgaris face  Good results 77m s/p  Isotretinoin, no txt at this time  Pruritus buttocks  Hx of Pruritus with excoriations, resolved  Cont Cln sports wash qd, leaving on for 1-2 minutes before rinsing off.  Cont Cln facial moisturizer qd to buttocks after cleaning with Cln sports wash   Return if symptoms worsen or fail to improve.  I, Ardis Rowan, RMA, am acting as scribe for Armida Sans, MD .

## 2021-09-17 ENCOUNTER — Encounter: Payer: Self-pay | Admitting: Dermatology

## 2021-10-04 ENCOUNTER — Other Ambulatory Visit: Payer: Self-pay | Admitting: Family Medicine

## 2021-10-04 DIAGNOSIS — I10 Essential (primary) hypertension: Secondary | ICD-10-CM

## 2021-10-22 ENCOUNTER — Ambulatory Visit (INDEPENDENT_AMBULATORY_CARE_PROVIDER_SITE_OTHER): Payer: Medicaid Other | Admitting: Family Medicine

## 2021-10-22 VITALS — BP 108/78 | HR 95 | Wt 159.8 lb

## 2021-10-22 DIAGNOSIS — M25561 Pain in right knee: Secondary | ICD-10-CM

## 2021-10-22 MED ORDER — DICLOFENAC SODIUM 1 % EX GEL: 2.0000 g | Freq: Four times a day (QID) | CUTANEOUS | 3 refills | Status: DC

## 2021-10-22 MED ORDER — ACETAMINOPHEN 500 MG PO TABS: 500.0000 mg | ORAL_TABLET | Freq: Four times a day (QID) | ORAL | 0 refills | Status: DC | PRN

## 2021-10-22 NOTE — Patient Instructions (Signed)
It was great seeing you today.  For your knee pain I want you to take over-the-counter medications such as Tylenol.  I have sent a prescription for this to your pharmacy.  I also sent a prescription for Voltaren gel which you can apply as needed.  If your pain worsens or does not resolve please be reevaluated.  I hope you have a wonderful day! ? ? ?

## 2021-10-22 NOTE — Progress Notes (Signed)
? ? ?  SUBJECTIVE:  ? ?CHIEF COMPLAINT / HPI:  ? ?Right knee pain ?Patient reports that for the last day or so he has been having pain in the right knee.  He denies any injury to the right knee.  Still has full range of motion and no difficulty ambulating.  Has chronic right lower extremity external rotation with ambulation which he reports has always been there.  His caregiver is present to help with history as well.  Reports that he has not taken any medications or tried ice or heat. ? ? ?OBJECTIVE:  ? ?BP 108/78   Pulse 95   Wt 159 lb 12.8 oz (72.5 kg)   SpO2 98%   BMI 28.31 kg/m?   ?General: Pleasant 36 year old male in no acute distress ?Cardiac: Regular rate ?Respiratory: Speaking in full sentences, normal work of breathing ?MSK: Tenderness to palpation along medial joint line of right knee, full range of motion, no pain with varus or valgus stress, negative anterior and posterior drawer, no edema or erythema or warmth in the knee ? ?ASSESSMENT/PLAN:  ? ?Acute pain of right knee ?Unclear cause of the right knee pain.  No signs of infection of the joint.  Physical exam only significant for pain along the medial joint line but otherwise within normal limits.  We will treat conservatively with over-the-counter NSAIDs such as Tylenol as well as ice, heat, elevation of the knee.  Also Voltaren gel.  Discussed strict return precautions.  Prescription sent for these medications because patient is in a group home and requires orders in order to take medications. ?  ? ? ?Gifford Shave, MD ?[place]  ? ?

## 2021-10-23 DIAGNOSIS — M25561 Pain in right knee: Secondary | ICD-10-CM | POA: Insufficient documentation

## 2021-10-23 NOTE — Assessment & Plan Note (Signed)
Unclear cause of the right knee pain.  No signs of infection of the joint.  Physical exam only significant for pain along the medial joint line but otherwise within normal limits.  We will treat conservatively with over-the-counter NSAIDs such as Tylenol as well as ice, heat, elevation of the knee.  Also Voltaren gel.  Discussed strict return precautions.  Prescription sent for these medications because patient is in a group home and requires orders in order to take medications. ?

## 2021-10-30 ENCOUNTER — Ambulatory Visit: Payer: Medicaid Other | Admitting: Dermatology

## 2021-11-06 ENCOUNTER — Ambulatory Visit: Payer: Medicaid Other | Admitting: Family Medicine

## 2021-12-04 NOTE — Progress Notes (Signed)
    SUBJECTIVE:   CHIEF COMPLAINT / HPI:   Blood Pressure F/U Thomas [name] is a 36 y.o. male who presents to the clinic today for follow up on her blood pressure. Current medications include Amlodipine 10 mg daily. Reports good compliance. Does not check home BP. Denies chest pain, shortness of breath, lower extremity edema, and vision changes.    PERTINENT  PMH / PSH:  Past Medical History:  Diagnosis Date   Acne    Anxiety    Impulse control disorder    IMPULSE CONTROL DISORDER 08/29/2009   Qualifier: Diagnosis of  By: Denyse Amass MD, Evan     Mental retardation, mild (I.Q. 50-70)    MENTAL RETARDATION, MODERATE 08/29/2009   Qualifier: Diagnosis of  By: Denyse Amass MD, [place] psychosis (HCC)    Ulcer of foot (HCC)    OBJECTIVE:   BP 122/80   Pulse 92   Temp 98.2 F (36.8 C)   Wt 161 lb 9.6 oz (73.3 kg)   SpO2 98%   BMI 28.63 kg/m    General: NAD, pleasant, able to participate in exam Cardiac: RRR, no murmurs. Respiratory: Normal respiratory effort Extremities: no edema or cyanosis. Psych: Normal affect and mood  ASSESSMENT/PLAN:   Hypertension Stable on Amlodipine 10 mg. Tolerating without adverse effects. -Continue Amlodipine -Encouraged exercise and lifestyle modifications -F/u in 1 year   Sabino Dick, DO [facility]

## 2021-12-10 ENCOUNTER — Encounter: Payer: Self-pay | Admitting: Family Medicine

## 2021-12-10 ENCOUNTER — Ambulatory Visit (INDEPENDENT_AMBULATORY_CARE_PROVIDER_SITE_OTHER): Payer: Medicaid Other | Admitting: Family Medicine

## 2021-12-10 VITALS — BP 122/80 | HR 92 | Temp 98.2°F | Wt 161.6 lb

## 2021-12-10 DIAGNOSIS — I10 Essential (primary) hypertension: Secondary | ICD-10-CM | POA: Diagnosis not present

## 2021-12-10 DIAGNOSIS — F250 Schizoaffective disorder, bipolar type: Secondary | ICD-10-CM | POA: Diagnosis present

## 2021-12-10 NOTE — Patient Instructions (Addendum)
It was wonderful to see you today.  Today we talked about:  -Continue your Amlodipine 10 mg. Let me know if you need additional refills, you should still be good until September or so. -I would encourage you to exercise and watch the salt in your diet- this will help as well.  Thank you for choosing [facility].   Please call [phone] with any questions about today's appointment.  Please be sure to schedule follow up at the front  desk before you leave today.   Sabino Dick, DO PGY-2 Family Medicine

## 2021-12-10 NOTE — Assessment & Plan Note (Addendum)
Stable on Amlodipine 10 mg. Tolerating without adverse effects. -Continue Amlodipine -Encouraged exercise and lifestyle modifications -F/u in 1 year

## 2021-12-16 ENCOUNTER — Other Ambulatory Visit: Payer: Self-pay | Admitting: *Deleted

## 2021-12-16 DIAGNOSIS — E559 Vitamin D deficiency, unspecified: Secondary | ICD-10-CM

## 2021-12-16 MED ORDER — VITAMIN D (CHOLECALCIFEROL) 25 MCG (1000 UT) PO TABS: ORAL_TABLET | ORAL | 99 refills | Status: DC

## 2022-01-16 ENCOUNTER — Other Ambulatory Visit: Payer: Self-pay | Admitting: *Deleted

## 2022-01-16 DIAGNOSIS — E559 Vitamin D deficiency, unspecified: Secondary | ICD-10-CM

## 2022-01-16 MED ORDER — VITAMIN D (CHOLECALCIFEROL) 25 MCG (1000 UT) PO TABS: ORAL_TABLET | ORAL | 99 refills | Status: DC

## 2022-02-10 ENCOUNTER — Other Ambulatory Visit: Payer: Self-pay | Admitting: Family Medicine

## 2022-05-01 ENCOUNTER — Other Ambulatory Visit: Payer: Self-pay

## 2022-05-01 DIAGNOSIS — I10 Essential (primary) hypertension: Secondary | ICD-10-CM

## 2022-05-01 MED ORDER — AMLODIPINE BESYLATE 10 MG PO TABS
ORAL_TABLET | ORAL | 6 refills | Status: DC
Start: 2022-05-01 — End: 2022-12-01

## 2022-06-02 ENCOUNTER — Other Ambulatory Visit: Payer: Self-pay | Admitting: *Deleted

## 2022-06-02 MED ORDER — DICLOFENAC SODIUM 1 % EX GEL
CUTANEOUS | 1 refills | Status: DC
Start: 2022-06-02 — End: 2022-11-03

## 2022-08-12 ENCOUNTER — Encounter: Payer: Medicaid Other | Admitting: Family Medicine

## 2022-08-12 NOTE — Patient Instructions (Incomplete)
It was wonderful to see you today.  Please bring ALL of your medications with you to every visit.   Today we talked about:  Today at your annual preventive visit we talked about the following measures: I recommend 150 minutes of exercise per week-try 30 minutes 5 days per week We discussed reducing sugary beverages (like soda and juice) and increasing leafy greens and whole fruits.  We discussed avoiding tobacco and alcohol.  I recommend avoiding illicit substances.  Your blood pressure is *** at goal of ***.    Thank you for coming to your visit as scheduled. We have had a large "no-show" problem lately, and this significantly limits our ability to see and care for patients. As a friendly reminder- if you cannot make your appointment please call to cancel. We do have a no show policy for those who do not cancel within 24 hours. Our policy is that if you miss or fail to cancel an appointment within 24 hours, 3 times in a 40-month period, you may be dismissed from our clinic.   Thank you for choosing [facility].   Please call [phone] with any questions about today's appointment.  Please be sure to schedule follow up at the front  desk before you leave today.   [name], DO PGY-3 Family Medicine

## 2022-08-12 NOTE — Progress Notes (Deleted)
    SUBJECTIVE:   Chief compliant/HPI: annual examination  Thomas [name] is a 37 y.o. who presents today for an annual exam.  Current Concerns: ***   Reviewed and updated history ***.   Review of systems form notable for ***.    OBJECTIVE:   There were no vitals taken for this visit. *** General: Awake, alert, oriented, in no acute distress, pleasant and cooperative with examination HEENT: Normocephalic, atraumatic, nares patent, dentition is good, oropharynx without erythema or exudates, TM's clear bilaterally, no thyroid nodules palpated Cardio: RRR without murmur, 2+ radial, DP and PT pulses b/l Respiratory: CTAB without wheezing/rhonchi/rales Abdomen: Soft, non-tender to palpation of all quadrants, non-distended, no rebound/guarding, no organomegaly MSK: Able to move all extremities spontaneously, good muscle strength, no abnormalities Extremities: without edema or cyanosis Neuro: Speech is clear and intact, no focal deficits, no facial asymmetry, follows commands  Psych: Normal mood and affect   ASSESSMENT/PLAN:   No problem-specific Assessment & Plan notes found for this encounter.    Annual Examination  See AVS for age appropriate recommendations  PHQ score ***, reviewed and discussed.  Blood pressure reviewed and at goal. ***   Advanced directive not discussed.    Considered the following items based upon USPSTF recommendations: HIV testing: {not indicated/requested/declined:14582} Hepatitis C: {not indicated/requested/declined:14582} Hepatitis B: {not indicated/requested/declined:14582} Syphilis if at high risk: {{not indicated/requested/declined:14582} GC/CT{not indicated/requested/declined:14582} Lipid panel (nonfasting or fasting) discussed based upon AHA recommendations and not ordered since last checked 09/2020. Consider repeat every 4-6 years.  Reviewed risk factors for latent tuberculosis and not indicated Immunizations ***   Follow up in 1 year or  sooner if indicated.    Thomas [name], [place]

## 2022-08-20 NOTE — Progress Notes (Signed)
SUBJECTIVE:   Chief compliant/HPI: annual examination  Thomas [name] is a 37 y.o. who presents today for an annual exam.  He is accompanied by his aide, he consents to have aide stay during examination.   Tobacco use: per his aide, he is smoking around 9-10 cigarettes a day. Has been smoking since the age of 36. He tried nicotine patches and gum in the past without success. Reports his desire to stop smoking is "so-so".   Drinks about 1 can of beer each Friday.  Likes to walk for exercise.   Reviewed and updated history.   OBJECTIVE:   BP 120/89   Pulse 97   Ht 5' 3"$  (1.6 m)   Wt 164 lb 6.4 oz (74.6 kg)   SpO2 100%   BMI 29.12 kg/m   Vitals:   08/21/22 1442 08/21/22 1506  BP: (!) 127/96 120/89  Pulse: 97   SpO2: 100%    General: Awake, alert, oriented, in no acute distress, pleasant and cooperative with examination, slowed responses HEENT: Normocephalic, atraumatic, nares patent, dentition is good, oropharynx without erythema or exudates Cardio: RRR without murmur Respiratory: CTAB without wheezing/rhonchi/rales Abdomen: Overweight, mildly distended, non-tender to palpation of all quadrants MSK: Able to move all extremities spontaneously, good muscle strength, no abnormalities Extremities: without edema Neuro: Speech is clear and intact though responses seem slowed, no focal deficits, no facial asymmetry, follows commands  Skin: Warm and dry, acne scars on face Psych: Normal mood and affect     08/21/2022    2:45 PM 10/22/2021   10:19 AM 03/12/2021    1:32 PM  Depression screen PHQ 2/9  Decreased Interest 3 1 3  $ Down, Depressed, Hopeless 1 1 1  $ PHQ - 2 Score 4 2 4  $ Altered sleeping 1 1 2  $ Tired, decreased energy 3 1 1  $ Change in appetite 1 1 1  $ Feeling bad or failure about yourself  0 0 0  Trouble concentrating 2 2 3  $ Moving slowly or fidgety/restless 1 1 1  $ Suicidal thoughts 0 0 0  PHQ-9 Score 12 8 12  $ Difficult doing work/chores Somewhat difficult  Not  difficult at all    ASSESSMENT/PLAN:   Hypertension On amlodipine 10 mg daily.  Elevated x 2 above goal but improved on second check. Will not adjust medications at this time. He seems motivated to make lifestyle changes.  His aide is present today and states he will assist him in this. - Recommend exercise, weight loss - Continue Amlodipine 10 mg  Schizoaffective disorder, bipolar type Managed by psychiatrist.  PHQ-9 was elevated at 12 today but patient reports that his mood is good.  He denies any symptoms or feelings of depression.  No SI. -Continue his current medications    Annual Examination  See AVS for age appropriate recommendations  PHQ score 12, reviewed and discussed.  Blood pressure reviewed and elevated above goal, slightly improved on recheck.    Advanced directive not discussed.    Considered the following items based upon USPSTF recommendations: HIV testing:  Previously tested and negative Hepatitis C: ordered Hepatitis B:  Declined Syphilis if at high risk: {declined GC/CT declined since not sexually active Lipid panel (nonfasting or fasting) discussed based upon AHA recommendations and not ordered since last checked 09/2020. Consider repeat every 4-6 years.  Reviewed risk factors for latent tuberculosis and not indicated Immunizations: Reports that he believes he already received COVID booster at outside facility.   Follow up in 1 year or sooner  if indicated.    [name], [place]

## 2022-08-20 NOTE — Patient Instructions (Addendum)
It was wonderful to see you today.  Please bring ALL of your medications with you to every visit.   Today we talked about:  Today at your annual preventive visit we talked about the following measures: I recommend 150 minutes of exercise per week-try 30 minutes 5 days per week We discussed reducing sugary beverages (like soda and juice) and increasing leafy greens and whole fruits.  We discussed avoiding tobacco and alcohol.  I recommend avoiding illicit substances.  Your blood pressure is elevated today. Goal is <130/80.   Tobacco use is damaging to your body. It increases your risk of stroke, heart attack, lung cancer, and serious lung disease in the future. It also reduces your fertility.   Quitting tobacco is the best thing for your health but is a challenge---nicotine, a chemical in cigarettes, is highly addictive.   You can call 1 800 QUIT NOW (1-(262)595-6323)---you will be connected with a Artist. They can also mail you nicotine gums, lozenges, and patches to quit.   Ask me about patches (which you wear all day) and gums (which you use when you have a craving) to help you quit.   There are safe, effective medications to help you quit--  Varencline---also called Chantix---- is the most common medication used to help people stop smoking. It starts a low dose and is increased. I recommended choosing a quit date then starting the medication 8 days before this. Side effects include mild headache, difficulty sleeping, and odd dreams. The medication is typically very well tolerated.   Bupropion---also called Zyban---- is started 1 week before your quit date. You take 1 pill for three days then increase to 1 pill twice per day. Side effects include a mild headache and anxiety---this usually goes away. Some patients experience weight loss.     Thank you for coming to your visit as scheduled. We have had a large "no-show" problem lately, and this significantly limits our ability  to see and care for patients. As a friendly reminder- if you cannot make your appointment please call to cancel. We do have a no show policy for those who do not cancel within 24 hours. Our policy is that if you miss or fail to cancel an appointment within 24 hours, 3 times in a 43-monthperiod, you may be dismissed from our clinic.   Thank you for choosing [facility]   Please call [phone] any questions about today's appointment.  Please be sure to schedule follow up at the front  desk before you leave today.   ASharion Settler DO PGY-3 Family Medicine

## 2022-08-21 ENCOUNTER — Encounter: Payer: Self-pay | Admitting: Family Medicine

## 2022-08-21 ENCOUNTER — Ambulatory Visit (INDEPENDENT_AMBULATORY_CARE_PROVIDER_SITE_OTHER): Payer: Medicaid Other | Admitting: Family Medicine

## 2022-08-21 VITALS — BP 120/89 | HR 97 | Ht 63.0 in | Wt 164.4 lb

## 2022-08-21 DIAGNOSIS — Z0000 Encounter for general adult medical examination without abnormal findings: Secondary | ICD-10-CM | POA: Diagnosis not present

## 2022-08-21 DIAGNOSIS — I10 Essential (primary) hypertension: Secondary | ICD-10-CM | POA: Diagnosis not present

## 2022-08-21 DIAGNOSIS — Z1159 Encounter for screening for other viral diseases: Secondary | ICD-10-CM

## 2022-08-21 DIAGNOSIS — F250 Schizoaffective disorder, bipolar type: Secondary | ICD-10-CM

## 2022-08-21 NOTE — Assessment & Plan Note (Signed)
Managed by psychiatrist.  PHQ-9 was elevated at 12 today but patient reports that his mood is good.  He denies any symptoms or feelings of depression.  No SI. -Continue his current medications

## 2022-08-21 NOTE — Assessment & Plan Note (Signed)
On amlodipine 10 mg daily.  Elevated x 2 above goal but improved on second check. Will not adjust medications at this time. He seems motivated to make lifestyle changes.  His aide is present today and states he will assist him in this. - Recommend exercise, weight loss - Continue Amlodipine 10 mg

## 2022-08-22 ENCOUNTER — Encounter: Payer: Self-pay | Admitting: Family Medicine

## 2022-08-22 LAB — HCV INTERPRETATION

## 2022-08-22 LAB — HCV AB W REFLEX TO QUANT PCR: HCV Ab: NONREACTIVE

## 2022-11-03 ENCOUNTER — Other Ambulatory Visit: Payer: Self-pay

## 2022-11-03 MED ORDER — DICLOFENAC SODIUM 1 % EX GEL: CUTANEOUS | 1 refills | Status: AC

## 2022-11-30 ENCOUNTER — Other Ambulatory Visit: Payer: Self-pay | Admitting: Family Medicine

## 2022-11-30 DIAGNOSIS — I10 Essential (primary) hypertension: Secondary | ICD-10-CM

## 2022-12-01 MED ORDER — ACETAMINOPHEN 500 MG PO TABS
500.0000 mg | ORAL_TABLET | Freq: Four times a day (QID) | ORAL | 0 refills | Status: AC | PRN
Start: 2022-12-01 — End: ?

## 2022-12-01 NOTE — Telephone Encounter (Signed)
**  After Hours/ Emergency Line Call**  Received a call to report that Thomas [name] is complaining of neck pain that started over the weekend, all information given by his in home health provider.  Endorsing continued neck pain, is going to a day home. No fevers, chills, confusion, sick contacts. Felt some back and neck pain but without trauma, may have slept on the neck wrong or overuse injury. Denying numbness or tingling in the arms or weakness. Red flags discussed. Low concern for meningitis given presentation or evidence of nerve compression. No trauma to cause concern for fx. Strict return precautions for all given. Sent refill of Tylenol to take with Ibuprofen until visit with ATC tomorrow.  Conservative treatment measures. Instructed to call me back with any changes. Will forward to PCP and ATC provider who will see.   Alfredo Martinez, MD PGY-2, [facility] 12/01/2022 8:22 AM

## 2022-12-02 ENCOUNTER — Ambulatory Visit (INDEPENDENT_AMBULATORY_CARE_PROVIDER_SITE_OTHER): Payer: Medicaid Other | Admitting: Family Medicine

## 2022-12-02 VITALS — BP 127/89 | HR 108 | Ht 63.0 in | Wt 160.4 lb

## 2022-12-02 DIAGNOSIS — M542 Cervicalgia: Secondary | ICD-10-CM | POA: Diagnosis not present

## 2022-12-02 NOTE — Progress Notes (Signed)
    SUBJECTIVE:   CHIEF COMPLAINT / HPI:   Patient presents with neck pain that started 3-4 days ago. He was laying in bed when it first started. Feels that the pain is still lingering, has never had this pain before. Denies any injury, trauma or known inciting event. Most of the pain seems to be along the posterior left side of his neck. Denies any radiating pain. Aggravating factors include bending his head downwards. Denies headaches, vision changes, nausea, vomiting or weakness. Describes the pain as an aching sensation along his muscle.   OBJECTIVE:   BP 127/89   Pulse (!) 108   Ht 5\' 3"  (1.6 m)   Wt 160 lb 6.4 oz (72.8 kg)   SpO2 100%   BMI 28.41 kg/m   General: Patient well-appearing, in no acute distress. CV: RRR, no murmurs or gallops auscultated Resp: CTAB, no wheezing, rales or rhonchi noted MSK: full active ROM of neck with mild pain elicited with left rotation, no restrictions to flexion, extension, sidebending and rotation bilaterally  Neuro: 5/5 UE strength bilaterally, gross sensation intact, normal gait   ASSESSMENT/PLAN:   Neck pain -acute cervical strain likely secondary, advised on the importance of using a proper pillow when sleeping. Neurological exam unremarkable. Reassuringly no evidence of weakness to indicate stroke. No suspicion for vertigo given lack of other symptoms. Does not seem consistent with cervical radiculopathy or neuropathic changes, no indication for imaging at this time.  -cervical rehab stretching exercises provided and discussed -tylenol or ibuprofen as needed for pain -return precautions discussed     Reece Leader, DO [facility]

## 2022-12-02 NOTE — Patient Instructions (Signed)
It was great seeing you today!  Today we discussed your neck pain, this is likely from neck strain when you were sleeping the wrong way. Make sure to use a pillow that is not flat and provides good support to your neck. Place the pillow in the position that we discussed. Attached are some neck stretching exercises, continue to do range of motion stretches. You may take ibuprofen or tylenol over the counter, every 6 hours for the pain. The pain should resolve within a week.   If the pain is getting worse or you are experiencing weakness then please schedule another appointment to be seen.   Please follow up at your next scheduled appointment, if anything arises between now and then, please don't hesitate to contact our office.   Thank you for allowing [place] to be a part of your medical care!  Thank you, Dr. Robyne Peers

## 2022-12-02 NOTE — Assessment & Plan Note (Signed)
-  acute cervical strain likely secondary, advised on the importance of using a proper pillow when sleeping. Neurological exam unremarkable. Reassuringly no evidence of weakness to indicate stroke. No suspicion for vertigo given lack of other symptoms. Does not seem consistent with cervical radiculopathy or neuropathic changes, no indication for imaging at this time.  -cervical rehab stretching exercises provided and discussed -tylenol or ibuprofen as needed for pain -return precautions discussed

## 2022-12-23 ENCOUNTER — Ambulatory Visit (INDEPENDENT_AMBULATORY_CARE_PROVIDER_SITE_OTHER): Payer: Medicaid Other | Admitting: Family Medicine

## 2022-12-23 VITALS — BP 110/74 | HR 95 | Wt 160.0 lb

## 2022-12-23 DIAGNOSIS — L732 Hidradenitis suppurativa: Secondary | ICD-10-CM

## 2022-12-23 MED ORDER — CLINDAMYCIN PHOSPHATE 1 % EX GEL: Freq: Two times a day (BID) | CUTANEOUS | 0 refills | Status: DC

## 2022-12-23 MED ORDER — DOXYCYCLINE HYCLATE 100 MG PO TABS: 100.0000 mg | ORAL_TABLET | Freq: Two times a day (BID) | ORAL | 0 refills | Status: AC

## 2022-12-23 NOTE — Progress Notes (Signed)
    SUBJECTIVE:   CHIEF COMPLAINT / HPI:   Patient presents with bump in his armpits bilaterally that he first noticed about 3 weeks ago. Presents with caregiver. Denies any drainage or associated odor. Denies any bleeding. Denies fever, chills or night sweats. History of hidradenitis suppurativa, seeing dermatology routinely for this. He says that this is similar to prior times with his hidradenitis suppurativa. He was given a medication that he does not recall, per chart review it seems that he was treated with both Humira injections and isoretinoin which has improved. So they recommended to hold off on using this. Last episode was about a year ago when he saw the dermatologist. Has not tried any medications.   OBJECTIVE:   BP 110/74   Pulse 95   Wt 160 lb (72.6 kg)   SpO2 98%   BMI 28.34 kg/m   General: Patient well-appearing, in no acute distress. Resp: normal work of breathing noted Derm: firm nodule less than 1 cm along the left axillary area with surrounding smaller nodules, no fluctuance, no surrounding erythema, no drainage or bleeding, minimal tenderness, no evidence of abnormality noted along right axillary area    ASSESSMENT/PLAN:   Suppurative hidradenitis -seems to be most consistent with hidradenitis suppurativa, does not seem to be infectious at this time -recommended to schedule with dermatologist at their earliest convenience, may benefit from continued isotretinoin  -given the recurrence of this issue, we also discussed the possibility of general surgery involvement as an option in the future as well -no indication for I&D at this time -recommended to apply warm compresses multiple times a day to the affected area -prescribed doxycycline 100 mg bid for 7 days -also prescribed clindamycin topical cream to be used after the antibiotic course is completed for any new formations in the interim before they see dermatology  -follow up as appropriate      Reece Leader,  DO [facility] Medicine Center

## 2022-12-23 NOTE — Assessment & Plan Note (Signed)
-  seems to be most consistent with hidradenitis suppurativa, does not seem to be infectious at this time -recommended to schedule with dermatologist at their earliest convenience, may benefit from continued isotretinoin  -given the recurrence of this issue, we also discussed the possibility of general surgery involvement as an option in the future as well -no indication for I&D at this time -recommended to apply warm compresses multiple times a day to the affected area -prescribed doxycycline 100 mg bid for 7 days -also prescribed clindamycin topical cream to be used after the antibiotic course is completed for any new formations in the interim before they see dermatology  -follow up as appropriate

## 2022-12-23 NOTE — Patient Instructions (Addendum)
It was great seeing you today!  Today we discussed your boils, this seems to be due to hidradenitis suppurativa which as you know is a chronic condition for you. Please apply a warm compress multiple times a day. I have also prescribed an antibiotic course, please take doxycycline 100 mg twice daily for 7 days. Additionally, I have prescribed a topical antibiotic to apply called clindamycin. You can apply this after you complete the doxycycline course for any bumps that arise after this.   I encourage you to schedule a visit with your dermatologist at your earliest convenience so that they can prescribe you your previous regimen.   Please follow up at your next scheduled appointment, if anything arises between now and then, please don't hesitate to contact our office.   Thank you for allowing [place] to be a part of your medical care!  Thank you, Dr. Robyne Peers

## 2022-12-29 ENCOUNTER — Telehealth: Payer: Self-pay

## 2022-12-29 NOTE — Telephone Encounter (Signed)
Rec'd PA request for patients clindamycin gel.   Medicaid prefers these alternatives:

## 2023-01-20 ENCOUNTER — Other Ambulatory Visit: Payer: Self-pay | Admitting: Family Medicine

## 2023-01-20 MED ORDER — CLINDAMYCIN PHOS-BENZOYL PEROX 1-5 % EX GEL: Freq: Two times a day (BID) | CUTANEOUS | 0 refills | Status: DC

## 2023-01-26 ENCOUNTER — Other Ambulatory Visit: Payer: Self-pay | Admitting: Family Medicine

## 2023-01-26 MED ORDER — CLINDAMYCIN PHOSPHATE 1 % EX SWAB: 1.0000 | Freq: Two times a day (BID) | CUTANEOUS | 1 refills | Status: DC

## 2023-01-27 ENCOUNTER — Telehealth: Payer: Self-pay

## 2023-01-27 NOTE — Telephone Encounter (Signed)
Received call from patient's pharmacy regarding Clindamycin swabs. She reports that insurance is requiring specific information to be included in prescription.   Please include the specific location that swabs are to be applied and the number of days (30 day supply?) that patient should be using prescription.   Forwarding to prescriber.   Veronda Prude, RN

## 2023-02-01 ENCOUNTER — Other Ambulatory Visit: Payer: Self-pay | Admitting: Family Medicine

## 2023-02-02 ENCOUNTER — Other Ambulatory Visit: Payer: Self-pay | Admitting: Family Medicine

## 2023-02-02 ENCOUNTER — Other Ambulatory Visit: Payer: Self-pay

## 2023-02-02 DIAGNOSIS — E559 Vitamin D deficiency, unspecified: Secondary | ICD-10-CM

## 2023-02-02 MED ORDER — VITAMIN D (CHOLECALCIFEROL) 25 MCG (1000 UT) PO TABS
ORAL_TABLET | ORAL | 99 refills | Status: DC
Start: 2023-02-02 — End: 2024-02-12

## 2023-02-02 MED ORDER — CLINDAMYCIN PHOSPHATE 1 % EX SWAB: CUTANEOUS | 1 refills | Status: DC

## 2023-02-26 ENCOUNTER — Other Ambulatory Visit: Payer: Self-pay

## 2023-02-26 ENCOUNTER — Ambulatory Visit (INDEPENDENT_AMBULATORY_CARE_PROVIDER_SITE_OTHER): Payer: MEDICAID | Admitting: Student

## 2023-02-26 VITALS — BP 117/80 | HR 84 | Ht 63.0 in | Wt 157.0 lb

## 2023-02-26 DIAGNOSIS — L03115 Cellulitis of right lower limb: Secondary | ICD-10-CM | POA: Diagnosis not present

## 2023-02-26 MED ORDER — DOXYCYCLINE HYCLATE 100 MG PO TABS
100.0000 mg | ORAL_TABLET | Freq: Two times a day (BID) | ORAL | 0 refills | Status: DC
Start: 2023-02-26 — End: 2023-02-26

## 2023-02-26 MED ORDER — DOXYCYCLINE HYCLATE 100 MG PO TABS
100.0000 mg | ORAL_TABLET | Freq: Two times a day (BID) | ORAL | 0 refills | Status: AC
Start: 2023-02-26 — End: 2023-03-05

## 2023-02-26 NOTE — Progress Notes (Signed)
  SUBJECTIVE:   CHIEF COMPLAINT / HPI:   Right Leg Pain and Swelling:  Ongoing 2 days per patient  Happened before: yes, h/o HS and cellulitis requiring abx in the past  No fever or chills, no systemic symptoms  Able to move foot well  Trauma: no Uses Clindamycin solution on HS areas as needed  Never had MRSA per chart review   PERTINENT  PMH / PSH: HTN Schizoaffective disorder Tobacco use   Patient Care Team: Ivery Quale, MD as PCP - General OBJECTIVE:  BP 117/80   Pulse 84   Ht 5\' 3"  (1.6 m)   Wt 157 lb (71.2 kg)   SpO2 100%   BMI 27.81 kg/m  Physical Exam  General: NAD, pleasant, able to participate in exam Card: RRR Respiratory: No respiratory distress Skin: warm and dry. Dry, no drainage. Induration and redness present as noted below Palpable DP pulses FROM foot and ankle   Psych: Normal affect and mood   ASSESSMENT/PLAN:  Cellulitis of right lower extremity Assessment & Plan: 10x15 cm and outlined with skin marker in clinic. Strict ED precautions if patient notices worsening or systemic symptoms. He expresses understanding, discussed with caregiver as well who reported that he will relay to patient's other caregivers. Stable for outpatient treatment. OTC analgesics as needed. Warm compresses. Return on Monday for close monitoring.   Orders: -     Doxycycline Hyclate; Take 1 tablet (100 mg total) by mouth 2 (two) times daily for 7 days.  Dispense: 14 tablet; Refill: 0   Return in about 4 days (around 03/02/2023). Alfredo Martinez, MD 02/27/2023, 7:49 AM PGY-3, [facility] Family Medicine

## 2023-02-26 NOTE — Patient Instructions (Addendum)
It was great to see you today! Thank you for choosing [facility] for your primary care.  Today we addressed: Leg pain and swelling --we will treat with Doxycycline 100 mg twice a day for seven days  I will measure the area and we would like you to return on Monday  If you haven't already, sign up for My Chart to have easy access to your labs results, and communication with your primary care physician. I recommend that you always bring your medications to each appointment as this makes it easy to ensure you are on the correct medications and helps [place] not miss refills when you need them. Call the clinic at [phone] if your symptoms worsen or you have any concerns.  Return in about 4 days (around 03/02/2023). Please arrive 15 minutes before your appointment to ensure smooth check in process.  We appreciate your efforts in making this happen.  Thank you for allowing me to participate in your care, Thomas Martinez, MD 02/26/2023, 3:10 PM PGY-3, [facility] Family Medicine

## 2023-02-27 NOTE — Assessment & Plan Note (Signed)
10x15 cm and outlined with skin marker in clinic. Strict ED precautions if patient notices worsening or systemic symptoms. He expresses understanding, discussed with caregiver as well who reported that he will relay to patient's other caregivers. Stable for outpatient treatment. OTC analgesics as needed. Warm compresses. Return on Monday for close monitoring.

## 2023-03-02 ENCOUNTER — Encounter: Payer: Self-pay | Admitting: Student

## 2023-03-02 ENCOUNTER — Ambulatory Visit: Payer: MEDICAID | Admitting: Student

## 2023-03-02 VITALS — BP 118/81 | HR 109 | Ht 63.0 in | Wt 155.0 lb

## 2023-03-02 DIAGNOSIS — L03115 Cellulitis of right lower limb: Secondary | ICD-10-CM | POA: Diagnosis not present

## 2023-03-02 MED ORDER — CEFDINIR 300 MG PO CAPS
300.0000 mg | ORAL_CAPSULE | Freq: Two times a day (BID) | ORAL | 0 refills | Status: AC
Start: 2023-03-02 — End: 2023-03-12

## 2023-03-02 NOTE — Patient Instructions (Addendum)
It was great to see you today! Thank you for choosing [facility] for your primary care.  Today we addressed: Continue with the doxycycline  Adding Cefdinir twice a day for ten days  Come back in 3-4 days   If you haven't already, sign up for My Chart to have easy access to your labs results, and communication with your primary care physician. I recommend that you always bring your medications to each appointment as this makes it easy to ensure you are on the correct medications and helps [place] not miss refills when you need them. Call the clinic at [phone] if your symptoms worsen or you have any concerns. Return in about 3 days (around 03/05/2023). Please arrive 15 minutes before your appointment to ensure smooth check in process.  We appreciate your efforts in making this happen.  Thank you for allowing me to participate in your care, Alfredo Martinez, MD 03/02/2023, 4:08 PM PGY-3, [facility] Family [facility]

## 2023-03-02 NOTE — Progress Notes (Signed)
  SUBJECTIVE:   CHIEF COMPLAINT / HPI:   Cellulitis  Treated with doxy x7days on last visit and traced with skin marker Taking doxy as instructed, presents with caretaker who verifies this  Reports today that his symptoms have worsened, 10/10 pain No fever or chills  No injury or trauma       Photo from last visit 8/22:   PERTINENT  PMH / PSH:  Reviewed   Patient Care Team: Ivery Quale, MD as PCP - General OBJECTIVE:  BP 118/81   Pulse (!) 109   Ht 5\' 3"  (1.6 m)   Wt 155 lb (70.3 kg)   SpO2 100%   BMI 27.46 kg/m  Physical Exam  General: NAD, pleasant, able to participate in exam Card: RRR Respiratory: No respiratory distress Skin: warm and dry, continued induration without fluctuance, skin peeling erythema slightly past the skin marker lines and TTP, palpable DP pulses and FROM foot and ankle  Psych: Normal affect and mood  ASSESSMENT/PLAN:  Cellulitis of right lower extremity Assessment & Plan: Continued slow worsening of infection despite doxycycline.  Keeping a close eye on the patient due to risk of necrotizing fasciitis.  However, he does not have disproportionate pain, fever, systemic symptoms nor does he have rapidly progressing erythema as would be expected and necrotizing fasciitis.  Will keep a close eye on him with extra gram-negative coverage with cefdinir in addition to his doxycycline.  Will return in about 3 to 4 days for further evaluation.  If worsening at that time, will send to emergency room for imaging and possible IV antibiotics.  Patient does have a history of increased heart rate here in clinic but on physical exam did not have tachycardia.   Orders: -     Cefdinir; Take 1 capsule (300 mg total) by mouth 2 (two) times daily for 10 days.  Dispense: 20 capsule; Refill: 0   Return in about 3 days (around 03/05/2023). Alfredo Martinez, MD 03/03/2023, 12:17 PM PGY-3, [facility] Family Medicine

## 2023-03-03 NOTE — Assessment & Plan Note (Signed)
Continued slow worsening of infection despite doxycycline.  Keeping a close eye on the patient due to risk of necrotizing fasciitis.  However, he does not have disproportionate pain, fever, systemic symptoms nor does he have rapidly progressing erythema as would be expected and necrotizing fasciitis.  Will keep a close eye on him with extra gram-negative coverage with cefdinir in addition to his doxycycline.  Will return in about 3 to 4 days for further evaluation.  If worsening at that time, will send to emergency room for imaging and possible IV antibiotics.  Patient does have a history of increased heart rate here in clinic but on physical exam did not have tachycardia.

## 2023-03-05 ENCOUNTER — Ambulatory Visit (INDEPENDENT_AMBULATORY_CARE_PROVIDER_SITE_OTHER): Payer: MEDICAID | Admitting: Student

## 2023-03-05 ENCOUNTER — Encounter: Payer: Self-pay | Admitting: Student

## 2023-03-05 VITALS — BP 108/70 | HR 110 | Temp 99.5°F | Ht 63.0 in | Wt 154.4 lb

## 2023-03-05 DIAGNOSIS — L03115 Cellulitis of right lower limb: Secondary | ICD-10-CM

## 2023-03-05 NOTE — Patient Instructions (Signed)
It was great to see you! Thank you for allowing me to participate in your care!  I recommend that you always bring your medications to each appointment as this makes it easy to ensure we are on the correct medications and helps [place] not miss when refills are needed.  Our plans for today:  - Cellulitis It looks like the infection is clearing up! Continue to take the antibiotics until they run out.  Make follow up appointment if:  Warmth, redness, color change, or swelling return  You develop fevers, or body aches, and leg looks worse   Take care and seek immediate care sooner if you develop any concerns.   Dr. Bess Kinds, MD [facility] Medicine

## 2023-03-05 NOTE — Progress Notes (Signed)
  SUBJECTIVE:   CHIEF COMPLAINT / HPI:   F/u Cellulitis -Patient seen 8/22 for cellulitis and started on doxy -Seen 8/26 w/o improvement/feeling worse, started on Cefdinir  Today: Report's he's doing well and the infection is improving. Notices he didn't feel well yesterday (felt warm w/ stuffy nose), but is feeling well today, and symptoms have resolved. Still taking the antibiotics, has completed the doxy and is still taking the cefdinir.   PERTINENT  PMH / PSH:    Patient Care Team: Ivery Quale, MD as PCP - General OBJECTIVE:  BP 108/70   Pulse (!) 110   Temp 99.5 F (37.5 C)   Ht 5\' 3"  (1.6 m)   Wt 154 lb 6.4 oz (70 kg)   SpO2 98%   BMI 27.35 kg/m  Physical Exam Constitutional:      General: He is not in acute distress.    Appearance: Normal appearance. He is not ill-appearing.  Skin:    Findings: Rash present. Rash is scaling.     Comments: Hyperpigmented rash with scaling located on right calf  Neurological:     Mental Status: He is alert.      ASSESSMENT/PLAN:  Cellulitis of right lower extremity Assessment & Plan: Cellulitis appears to be improving, patient feeling well.  Patient notes he continues to take antibiotics.  Patient notes he was feeling a little unwell yesterday, but but that has not resolved.  On physical exam cellulitis appears improved from previous image, with less erythema, thinner borders, and hyperpigmentation decreasing.  Will have patient complete course of antibiotics, and follow-up as needed. - Continue cefdinir - Return precautions for (worsening erythema/swelling/pain or fever)    No follow-ups on file. Bess Kinds, MD 03/05/2023, 3:30 PM PGY-3, [facility] Family Medicine

## 2023-03-05 NOTE — Assessment & Plan Note (Signed)
Cellulitis appears to be improving, patient feeling well.  Patient notes he continues to take antibiotics.  Patient notes he was feeling a little unwell yesterday, but but that has not resolved.  On physical exam cellulitis appears improved from previous image, with less erythema, thinner borders, and hyperpigmentation decreasing.  Will have patient complete course of antibiotics, and follow-up as needed. - Continue cefdinir - Return precautions for (worsening erythema/swelling/pain or fever)

## 2023-04-23 ENCOUNTER — Telehealth: Payer: Self-pay

## 2023-04-23 NOTE — Telephone Encounter (Signed)
Patient's caregiver, Virl Diamond, calls nurse line regarding patient. Reports that patient has been complaining of sneezing, runny nose and slight cough for the last few days. Denies fever or chills.   Provided with supportive measures and return precautions.   Veronda Prude, RN

## 2023-05-01 ENCOUNTER — Other Ambulatory Visit: Payer: Self-pay | Admitting: Family Medicine

## 2023-05-01 NOTE — Telephone Encounter (Signed)
Refilled rx. Patient has dermatology appt scheduled for 05/28/23.

## 2023-05-12 ENCOUNTER — Ambulatory Visit (INDEPENDENT_AMBULATORY_CARE_PROVIDER_SITE_OTHER): Payer: MEDICAID | Admitting: Student

## 2023-05-12 ENCOUNTER — Other Ambulatory Visit: Payer: Self-pay

## 2023-05-12 VITALS — BP 126/90 | HR 115 | Ht 63.0 in | Wt 148.6 lb

## 2023-05-12 DIAGNOSIS — L732 Hidradenitis suppurativa: Secondary | ICD-10-CM | POA: Diagnosis not present

## 2023-05-12 MED ORDER — METHYLPREDNISOLONE ACETATE 80 MG/ML IJ SUSP
80.0000 mg | Freq: Once | INTRAMUSCULAR | Status: AC
  Administered 2023-05-12: 80 mg via INTRAMUSCULAR

## 2023-05-12 MED ORDER — DOXYCYCLINE HYCLATE 100 MG PO TABS
100.0000 mg | ORAL_TABLET | Freq: Two times a day (BID) | ORAL | 0 refills | Status: DC
Start: 2023-05-12 — End: 2023-09-17

## 2023-05-12 NOTE — Progress Notes (Unsigned)
    SUBJECTIVE:   CHIEF COMPLAINT / HPI:   Hidradenitis Suppurativa Patient has a longstanding history of hidradenitis suppurativa.  Typically gets his flares in his armpits.  Here today with a worse than usual flare under his left armpit.  Tells me that he is having a brewing flare for about 2 months now but comes in today due to increasing pain and malodorous drainage.  He was referred to dermatology back in February but has not seen them yet.  He does have an appointment scheduled with them later this month.  PERTINENT  PMH / PSH: Schizoaffective Disorder Bipolar Type; Suppurative Hidradenitis   OBJECTIVE:   BP (!) 126/90   Pulse 90   Ht 5\' 3"  (1.6 m)   Wt 148 lb 9.6 oz (67.4 kg)   SpO2 100%   BMI 26.32 kg/m   Gen: Well-appearing and NAD, somewhat flat affect Skin: Numerous lesions in the bilateral axillae. While the right axilla is quiescent, the largest lesion is on the left and is actively draining malodorous purulent discharge.       ASSESSMENT/PLAN:   Suppurative hidradenitis Active lesions to the left axilla.  Has never been on prolonged antibiotics as far as I can tell.  Has dermatology follow-up scheduled later this month.  After some shared decision making, we opted to go ahead and put him on a prolonged course of full dose doxycycline until he sees dermatology.  Would likely be a good candidate for biologic therapy.  Also made the shared decision to do an intralesional steroid injection into the largest of these lesions on the left axilla.   -Informed consent was obtained, the area was prepped with alcohol and then the lesion was injected with 80 mg of methylprednisolone.  The procedure was tolerated well with minimal estimated blood loss. -Doxycycline 100 mg twice daily, 30-day supply sent to patient's pharmacy -Has follow-up with dermatology at the end of this month     J Dorothyann Gibbs, MD [facility] Medicine Center

## 2023-05-12 NOTE — Patient Instructions (Signed)
I'm so glad you have a dermatologist appointment. They will likely reach for what we call a "biologic" therapy. In the meantime, I want you to take doxycycline twice daily for the next month. We also injected some steroid into the biggest angry lesion on your left side, this should help to knock down the inflammation.    Thomas Mccoy, MD

## 2023-05-13 NOTE — Assessment & Plan Note (Signed)
Active lesions to the left axilla.  Has never been on prolonged antibiotics as far as I can tell.  Has dermatology follow-up scheduled later this month.  After some shared decision making, we opted to go ahead and put him on a prolonged course of full dose doxycycline until he sees dermatology.  Would likely be a good candidate for biologic therapy.  Also made the shared decision to do an intralesional steroid injection into the largest of these lesions on the left axilla.   -Informed consent was obtained, the area was prepped with alcohol and then the lesion was injected with 80 mg of methylprednisolone.  The procedure was tolerated well with minimal estimated blood loss. -Doxycycline 100 mg twice daily, 30-day supply sent to patient's pharmacy -Has follow-up with dermatology at the end of this month

## 2023-05-28 ENCOUNTER — Ambulatory Visit (INDEPENDENT_AMBULATORY_CARE_PROVIDER_SITE_OTHER): Payer: MEDICAID | Admitting: Dermatology

## 2023-05-28 DIAGNOSIS — Z79899 Other long term (current) drug therapy: Secondary | ICD-10-CM | POA: Diagnosis not present

## 2023-05-28 DIAGNOSIS — Z5181 Encounter for therapeutic drug level monitoring: Secondary | ICD-10-CM | POA: Diagnosis not present

## 2023-05-28 DIAGNOSIS — L732 Hidradenitis suppurativa: Secondary | ICD-10-CM | POA: Diagnosis not present

## 2023-05-28 DIAGNOSIS — Z7189 Other specified counseling: Secondary | ICD-10-CM | POA: Diagnosis not present

## 2023-05-28 MED ORDER — DOXYCYCLINE HYCLATE 100 MG PO CAPS: 100.0000 mg | ORAL_CAPSULE | Freq: Every day | ORAL | 3 refills | Status: DC

## 2023-05-28 NOTE — Progress Notes (Signed)
   Follow Up Visit   Subjective  Thomas [name] is a 37 y.o. male who presents for the following: bumps on the buttocks, groin, left axilla. He saw PCP and had I&D of the left axilla and was put on doxycycline 100 mg twice a day for 1 month (started 05/12/23). Area has improved since starting doxycycline. The patient has been on isotretinoin in the past (finished 03/2021) for Hidradenitis Suppurativa.   Patient accompanied by caregiver history  who contributes to history   The following portions of the chart were reviewed this encounter and updated as appropriate: medications, allergies, medical history  Review of Systems:  No other skin or systemic complaints except as noted in HPI or Assessment and Plan.  Objective  Well appearing patient in no apparent distress; mood and affect are within normal limits.  A focused examination was performed of the following areas:  Face, axilla  Relevant exam findings are noted in the Assessment and Plan.    Assessment & Plan   Hidradenitis suppurativa  Related Procedures Comprehensive metabolic panel CBC with Differential/Platelet Hepatitis B surface antibody,qualitative Hepatitis B surface antigen HIV Antibody (routine testing w rflx) QuantiFERON-TB Gold Plus  Medication management  Counseling and coordination of care  Long-term use of high-risk medication    HIDRADENITIS SUPPURATIVA Exam:  Sub q nodules of the left axilla.  Chronic and persistent condition with duration or expected duration over one year. Condition is bothersome/symptomatic for patient. Currently flared.   Hidradenitis Suppurativa is a chronic; persistent; non-curable, but treatable condition due to abnormal inflamed sweat glands in the body folds (axilla, inframammary, groin, medial thighs), causing recurrent painful draining cysts and scarring. It can be associated with severe scarring acne and cysts; also abscesses and scarring of scalp. The goal is control and  prevention of flares, as it is not curable. Scars are permanent and can be thickened. Treatment may include daily use of topical medication and oral antibiotics.  Oral isotretinoin may also be helpful.  For some cases, Humira or Cosentyx (biologic injections) may be prescribed to decrease the inflammatory process and prevent flares.  When indicated, inflamed cysts may also be treated surgically.  Treatment Plan: Patient was treated with isotretinoin 2 years ago. Hep C antibody labs 08/21/2022 negative. Discussed Bimzelx injections. May start pending labs.   Finish doxycycline 100 mg BID as prescibed by PCP, then continue but decreasing to every day with food dsp #30 3Rf. Doxycycline should be taken with food to prevent nausea. Do not lay down for 30 minutes after taking. Be cautious with sun exposure and use good sun protection while on this medication. Pregnant women should not take this medication.   Long term medication management.  Patient is using long term (months to years) prescription medication  to control their dermatologic condition.  These medications require periodic monitoring to evaluate for efficacy and side effects and may require periodic laboratory monitoring.   Return in about 2 months (around 07/28/2023).  Wendee Beavers, CMA, am acting as scribe for Armida Sans, MD .   Documentation: I have reviewed the above documentation for accuracy and completeness, and I agree with the above.  Armida Sans, MD

## 2023-05-28 NOTE — Patient Instructions (Addendum)
Doxycycline 100 mg - finish twice daily as prescribed by PCP, then decrease to once daily with food.  Doxycycline should be taken with food to prevent nausea. Do not lay down for 30 minutes after taking. Be cautious with sun exposure and use good sun protection while on this medication. Pregnant women should not take this medication.    Due to recent changes in healthcare laws, you may see results of your pathology and/or laboratory studies on MyChart before the doctors have had a chance to review them. We understand that in some cases there may be results that are confusing or concerning to you. Please understand that not all results are received at the same time and often the doctors may need to interpret multiple results in order to provide you with the best plan of care or course of treatment. Therefore, we ask that you please give [place] 2 business days to thoroughly review all your results before contacting the office for clarification. Should we see a critical lab result, you will be contacted sooner.   If You Need Anything After Your Visit  If you have any questions or concerns for your doctor, please call our main line at [phone] and press option 4 to reach your doctor's medical assistant. If no one answers, please leave a voicemail as directed and we will return your call as soon as possible. Messages left after 4 pm will be answered the following business day.   You may also send [place] a message via MyChart. We typically respond to MyChart messages within 1-2 business days.  For prescription refills, please ask your pharmacy to contact our office. Our fax number is [phone].  If you have an urgent issue when the clinic is closed that cannot wait until the next business day, you can page your doctor at the number below.    Please note that while we do our best to be available for urgent issues outside of office hours, we are not available 24/7.   If you have an urgent issue and are  unable to reach [place], you may choose to seek medical care at your doctor's office, retail clinic, urgent care center, or emergency room.  If you have a medical emergency, please immediately call 911 or go to the emergency department.  Pager Numbers  - Dr. Gwen Pounds: [phone]  - Dr. Roseanne Reno: [phone]  - Dr. Katrinka Blazing: [phone]   In the event of inclement weather, please call our main line at [phone] for an update on the status of any delays or closures.  Dermatology Medication Tips: Please keep the boxes that topical medications come in in order to help keep track of the instructions about where and how to use these. Pharmacies typically print the medication instructions only on the boxes and not directly on the medication tubes.   If your medication is too expensive, please contact our office at [phone] option 4 or send [place] a message through MyChart.   We are unable to tell what your co-pay for medications will be in advance as this is different depending on your insurance coverage. However, we may be able to find a substitute medication at lower cost or fill out paperwork to get insurance to cover a needed medication.   If a prior authorization is required to get your medication covered by your insurance company, please allow [place] 1-2 business days to complete this process.  Drug prices often vary depending on where the prescription is filled and some pharmacies may offer cheaper  prices.  The website www.goodrx.com contains coupons for medications through different pharmacies. The prices here do not account for what the cost may be with help from insurance (it may be cheaper with your insurance), but the website can give you the price if you did not use any insurance.  - You can print the associated coupon and take it with your prescription to the pharmacy.  - You may also stop by our office during regular business hours and pick up a GoodRx coupon card.  - If you need your  prescription sent electronically to a different pharmacy, notify our office through [facility] or by phone at [phone] option 4.     Si Usted Necesita Algo Despus de Su Visita  Tambin puede enviarnos un mensaje a travs de Clinical cytogeneticist. Por lo general respondemos a los mensajes de MyChart en el transcurso de 1 a 2 das hbiles.  Para renovar recetas, por favor pida a su farmacia que se ponga en contacto con nuestra oficina. Annie Sable de fax es [place] [phone].  Si tiene un asunto urgente cuando la clnica est cerrada y que no puede esperar hasta el siguiente da hbil, puede llamar/localizar a su doctor(a) al nmero que aparece a continuacin.   Por favor, tenga en cuenta que aunque hacemos todo lo posible para estar disponibles para asuntos urgentes fuera del horario de [place], no estamos disponibles las 24 horas del da, los 7 [address]  [address] de la [place].   Si tiene un problema urgente y no puede comunicarse con nosotros, puede optar por buscar atencin mdica  en el consultorio de su doctor(a), en una clnica privada, en un centro de atencin urgente o en una sala de emergencias.  Si tiene Engineer, drilling, por favor llame inmediatamente al 911 o vaya a la sala de emergencias.  Nmeros de bper  - Dr. Gwen Pounds: [phone]  - Dra. Roseanne Reno: [phone]  - Dr. Katrinka Blazing: [phone]   En caso de inclemencias del tiempo, por favor llame a Lacy Duverney principal al [phone] para una actualizacin sobre el [place] de cualquier retraso o cierre.  Consejos para la medicacin en dermatologa: Por favor, guarde las cajas en las que vienen los medicamentos de uso tpico para ayudarle a seguir las instrucciones sobre dnde y cmo usarlos. Las farmacias generalmente imprimen las instrucciones del medicamento slo en las cajas y no directamente en los tubos del [place].   Si su medicamento es muy caro, por favor, pngase en contacto con Rolm Gala llamando al  [phone] y presione la opcin 4 o envenos un mensaje a travs de Clinical cytogeneticist.   No podemos decirle cul ser su copago por los medicamentos por adelantado ya que esto es diferente dependiendo de la cobertura de su seguro. Sin embargo, es posible que podamos encontrar un medicamento sustituto a Audiological scientist un formulario para que el seguro cubra el medicamento que se considera necesario.   Si se requiere una autorizacin previa para que su compaa de seguros [place] su medicamento, por favor permtanos de 1 a 2 das hbiles para completar [address]  Los precios de los medicamentos varan con frecuencia dependiendo del Environmental consultant de dnde se surte la receta y alguna farmacias pueden ofrecer precios ms baratos.  El sitio web www.goodrx.com tiene cupones para medicamentos de Health and safety inspector. Los precios aqu no tienen en cuenta lo que podra costar con la ayuda del seguro (puede ser ms barato con su seguro), pero el sitio web puede darle el precio si no utiliz ningn  seguro.  - Puede imprimir el cupn correspondiente y llevarlo con su receta a la farmacia.  - Tambin puede pasar por nuestra oficina durante el horario de atencin regular y Education officer, museum una tarjeta de cupones de GoodRx.  - Si necesita que su receta se enve electrnicamente a una farmacia diferente, informe a nuestra oficina a travs de MyChart de [place] o por telfono llamando al [phone] y presione la opcin 4.

## 2023-06-02 ENCOUNTER — Encounter: Payer: Self-pay | Admitting: Dermatology

## 2023-06-15 ENCOUNTER — Telehealth: Payer: Self-pay

## 2023-06-15 DIAGNOSIS — Z79899 Other long term (current) drug therapy: Secondary | ICD-10-CM

## 2023-06-15 LAB — CBC WITH DIFFERENTIAL/PLATELET
Basophils Absolute: 0.1 10*3/uL (ref 0.0–0.2)
Basos: 0 %
EOS (ABSOLUTE): 0.2 10*3/uL (ref 0.0–0.4)
Eos: 1 %
Hematocrit: 36.6 % — ABNORMAL LOW (ref 37.5–51.0)
Hemoglobin: 11.6 g/dL — ABNORMAL LOW (ref 13.0–17.7)
Immature Grans (Abs): 0 10*3/uL (ref 0.0–0.1)
Immature Granulocytes: 0 %
Lymphocytes Absolute: 2.8 10*3/uL (ref 0.7–3.1)
Lymphs: 17 %
MCH: 26 pg — ABNORMAL LOW (ref 26.6–33.0)
MCHC: 31.7 g/dL (ref 31.5–35.7)
MCV: 82 fL (ref 79–97)
Monocytes Absolute: 0.8 10*3/uL (ref 0.1–0.9)
Monocytes: 5 %
Neutrophils Absolute: 12.2 10*3/uL — ABNORMAL HIGH (ref 1.4–7.0)
Neutrophils: 77 %
Platelets: 387 10*3/uL (ref 150–450)
RBC: 4.46 x10E6/uL (ref 4.14–5.80)
RDW: 16.1 % — ABNORMAL HIGH (ref 11.6–15.4)
WBC: 16 10*3/uL — ABNORMAL HIGH (ref 3.4–10.8)

## 2023-06-15 LAB — HEPATITIS B SURFACE ANTIGEN: Hepatitis B Surface Ag: NEGATIVE

## 2023-06-15 LAB — COMPREHENSIVE METABOLIC PANEL
ALT: 15 [IU]/L (ref 0–44)
AST: 14 [IU]/L (ref 0–40)
Albumin: 3.9 g/dL — ABNORMAL LOW (ref 4.1–5.1)
Alkaline Phosphatase: 152 [IU]/L — ABNORMAL HIGH (ref 44–121)
BUN/Creatinine Ratio: 9 (ref 9–20)
BUN: 7 mg/dL (ref 6–20)
Bilirubin Total: <0.2 mg/dL (ref 0.0–1.2)
CO2: 22 mmol/L (ref 20–29)
Calcium: 9.3 mg/dL (ref 8.7–10.2)
Chloride: 103 mmol/L (ref 96–106)
Creatinine, Ser: 0.78 mg/dL (ref 0.76–1.27)
Globulin, Total: 4 g/dL (ref 1.5–4.5)
Glucose: 106 mg/dL — ABNORMAL HIGH (ref 70–99)
Potassium: 3.6 mmol/L (ref 3.5–5.2)
Sodium: 140 mmol/L (ref 134–144)
Total Protein: 7.9 g/dL (ref 6.0–8.5)
eGFR: 119 mL/min/{1.73_m2} (ref >59)

## 2023-06-15 LAB — QUANTIFERON-TB GOLD PLUS
QuantiFERON Mitogen Value: >10 [IU]/mL
QuantiFERON Nil Value: 0.04 [IU]/mL
QuantiFERON TB1 Ag Value: 0.04 [IU]/mL
QuantiFERON TB2 Ag Value: 0.02 [IU]/mL
QuantiFERON-TB Gold Plus: NEGATIVE

## 2023-06-15 LAB — HIV ANTIBODY (ROUTINE TESTING W REFLEX): HIV Screen 4th Generation wRfx: NONREACTIVE

## 2023-06-15 LAB — HEPATITIS B SURFACE ANTIBODY,QUALITATIVE: Hep B Surface Ab, Qual: NONREACTIVE

## 2023-06-15 NOTE — Telephone Encounter (Addendum)
Tried calling patient regarding lab results. No answer. LM for patient to return call.  Order Hepatitis C - will let patient know when he returns call that lab slip has been printed and patient can pick up at front desk and have done in 2 weeks.   Will send in Bimzelx for Hidradenitis suppurativa to [facility]   ----- Message from Armida Sans sent at 06/15/2023  4:32 PM EST ----- Labs from 06/11/2023 show: Chemistries including liver and kidney tests all OK,  slightly elevated Alk Phos and slightly low Albumin, but not a problem - should review with PCP. Blood counts show mild anemia - review with PCP Hepatitis B studies negative / normal HIV negative / normal TB test /Quantiferon Gold = negative /Normal  Hepatitis C not ordered - please order - pt may get done in next 2 weeks.  May order and start Bimzelx for Hidradenitis suppurativa

## 2023-06-16 ENCOUNTER — Telehealth: Payer: Self-pay

## 2023-06-16 NOTE — Telephone Encounter (Addendum)
Tried calling patient again regarding results and need to have hepatitis C lab checked. Patient did not answer. LM for patient to return call  Have order lab test.   ----- Message from Armida Sans sent at 06/15/2023  4:32 PM EST ----- Labs from 06/11/2023 show: Chemistries including liver and kidney tests all OK,  slightly elevated Alk Phos and slightly low Albumin, but not a problem - should review with PCP. Blood counts show mild anemia - review with PCP Hepatitis B studies negative / normal HIV negative / normal TB test /Quantiferon Gold = negative /Normal  Hepatitis C not ordered - please order - pt may get done in next 2 weeks.  May order and start Bimzelx for Hidradenitis suppurativa

## 2023-06-17 NOTE — Telephone Encounter (Signed)
Patient's care taker Virl Diamond advised of information. aw

## 2023-06-18 ENCOUNTER — Telehealth: Payer: Self-pay

## 2023-06-18 DIAGNOSIS — L732 Hidradenitis suppurativa: Secondary | ICD-10-CM

## 2023-06-18 NOTE — Telephone Encounter (Addendum)
Called and spoke with patient's caregiver regarding lab results and new medication start.  He verbalized understanding and will take patient to have additional lab test done for Hepatitis C in 2 weeks.  Order Bimzelx for HS  Medication sent to [facility]    ----- Message from Armida Sans sent at 06/15/2023  4:32 PM EST ----- Labs from 06/11/2023 show: Chemistries including liver and kidney tests all OK,  slightly elevated Alk Phos and slightly low Albumin, but not a problem - should review with PCP. Blood counts show mild anemia - review with PCP Hepatitis B studies negative / normal HIV negative / normal TB test /Quantiferon Gold = negative /Normal  Hepatitis C not ordered - please order - pt may get done in next 2 weeks.  May order and start Bimzelx for Hidradenitis suppurativa

## 2023-06-22 ENCOUNTER — Telehealth: Payer: Self-pay

## 2023-06-22 MED ORDER — BIMZELX 160 MG/ML ~~LOC~~ SOAJ: 320.0000 mg | SUBCUTANEOUS | 2 refills | Status: DC

## 2023-06-22 MED ORDER — BIMZELX 160 MG/ML ~~LOC~~ SOAJ: 320.0000 mg | SUBCUTANEOUS | 4 refills | Status: DC

## 2023-06-22 NOTE — Telephone Encounter (Signed)
Correction of RX to [employer] [facility] [employer]. aw

## 2023-07-09 ENCOUNTER — Telehealth: Payer: Self-pay

## 2023-07-09 NOTE — Telephone Encounter (Signed)
 Bimzelx clarification called into [facility] pharmacy today. HS dosing is 320mg  every 2 weeks for week 0- week 16 and then maintenance every 4 weeks thereafter. aw

## 2023-07-13 ENCOUNTER — Telehealth: Payer: Self-pay

## 2023-07-13 NOTE — Telephone Encounter (Signed)
 Care taker called with questions about medication and lab work. Called Chuck back but no answer and no option for voicemail. aw

## 2023-07-14 ENCOUNTER — Other Ambulatory Visit: Payer: Self-pay | Admitting: Student

## 2023-07-14 DIAGNOSIS — I10 Essential (primary) hypertension: Secondary | ICD-10-CM

## 2023-07-14 NOTE — Telephone Encounter (Signed)
 Chart reviewed. Rx refilled.

## 2023-07-27 ENCOUNTER — Telehealth: Payer: Self-pay

## 2023-07-27 DIAGNOSIS — L732 Hidradenitis suppurativa: Secondary | ICD-10-CM

## 2023-07-27 NOTE — Telephone Encounter (Signed)
Left voicemail for Saul Fordyce to return my call about patient. Bimzelx not covered by insurance. There is a patient assistance form patient and care giver can fill out, but it will not likely be approved. I can send in Humira since that is covered by patient's insurance if they would like to forgo the patient assistance form.

## 2023-07-27 NOTE — Telephone Encounter (Signed)
Patient must try and fail Humira before Bimzelx is approved. Please advise.

## 2023-08-04 MED ORDER — HUMIRA (2 PEN) 80 MG/0.8ML ~~LOC~~ AJKT: AUTO-INJECTOR | SUBCUTANEOUS | 5 refills | Status: DC

## 2023-08-04 MED ORDER — HUMIRA-CD/UC/HS STARTER 80 MG/0.8ML ~~LOC~~ AJKT: 160.0000 mg | AUTO-INJECTOR | SUBCUTANEOUS | 0 refills | Status: DC

## 2023-08-04 NOTE — Telephone Encounter (Signed)
I spoke with patient's caregiver Saul Fordyce, and since the Bimzelx will likely not be covered he would like the Humira sent instead.

## 2023-08-04 NOTE — Addendum Note (Signed)
Addended by: Cari Caraway A on: 08/04/2023 03:20 PM   Modules accepted: Orders

## 2023-08-05 ENCOUNTER — Ambulatory Visit: Payer: MEDICAID | Admitting: Dermatology

## 2023-09-08 ENCOUNTER — Ambulatory Visit: Payer: MEDICAID | Admitting: Dermatology

## 2023-09-09 ENCOUNTER — Other Ambulatory Visit: Payer: Self-pay | Admitting: Family Medicine

## 2023-09-10 NOTE — Telephone Encounter (Signed)
 Chart reviewed. Rx refilled as needed. Patient with upcoming dermatology appt.

## 2023-09-17 ENCOUNTER — Other Ambulatory Visit: Payer: Self-pay

## 2023-09-17 DIAGNOSIS — L732 Hidradenitis suppurativa: Secondary | ICD-10-CM

## 2023-09-17 MED ORDER — DOXYCYCLINE HYCLATE 100 MG PO CAPS: 100.0000 mg | ORAL_CAPSULE | Freq: Every day | ORAL | 3 refills | Status: DC

## 2023-09-17 NOTE — Progress Notes (Signed)
Fax received from pharmacy requesting refill

## 2023-10-20 ENCOUNTER — Ambulatory Visit: Payer: MEDICAID | Admitting: Dermatology

## 2023-11-19 ENCOUNTER — Ambulatory Visit: Payer: MEDICAID | Admitting: Dermatology

## 2023-12-07 ENCOUNTER — Ambulatory Visit (INDEPENDENT_AMBULATORY_CARE_PROVIDER_SITE_OTHER): Payer: MEDICAID | Admitting: Dermatology

## 2023-12-07 ENCOUNTER — Encounter: Payer: Self-pay | Admitting: Dermatology

## 2023-12-07 DIAGNOSIS — L732 Hidradenitis suppurativa: Secondary | ICD-10-CM

## 2023-12-07 MED ORDER — DOXYCYCLINE HYCLATE 100 MG PO CAPS
100.0000 mg | ORAL_CAPSULE | Freq: Two times a day (BID) | ORAL | 4 refills | Status: DC
Start: 2023-12-07 — End: 2024-05-09

## 2023-12-07 MED ORDER — DOXYCYCLINE HYCLATE 100 MG PO CAPS: 100.0000 mg | ORAL_CAPSULE | Freq: Two times a day (BID) | ORAL | 0 refills | Status: DC

## 2023-12-07 NOTE — Patient Instructions (Addendum)
 Treatment Plan: Discontinue Humira per patient preference.  Restart doxycycline  100 mg twice daily with food.   Doxycycline  should be taken with food to prevent nausea. Do not lay down for 30 minutes after taking. Be cautious with sun exposure and use good sun protection while on this medication. Pregnant women should not take this medication.    Due to recent changes in healthcare laws, you may see results of your pathology and/or laboratory studies on MyChart before the doctors have had a chance to review them. We understand that in some cases there may be results that are confusing or concerning to you. Please understand that not all results are received at the same time and often the doctors may need to interpret multiple results in order to provide you with the best plan of care or course of treatment. Therefore, we ask that you please give us  2 business days to thoroughly review all your results before contacting the office for clarification. Should we see a critical lab result, you will be contacted sooner.   If You Need Anything After Your Visit  If you have any questions or concerns for your doctor, please call our main line at [phone] and press option 4 to reach your doctor's medical assistant. If no one answers, please leave a voicemail as directed and we will return your call as soon as possible. Messages left after 4 pm will be answered the following business day.   You may also send us  a message via MyChart. We typically respond to MyChart messages within 1-2 business days.  For prescription refills, please ask your pharmacy to contact our office. Our fax number is [phone].  If you have an urgent issue when the clinic is closed that cannot wait until the next business day, you can page your doctor at the number below.    Please note that while we do our best to be available for urgent issues outside of office hours, we are not available 24/7.   If you have an urgent  issue and are unable to reach us , you may choose to seek medical care at your doctor's office, retail clinic, urgent care center, or emergency room.  If you have a medical emergency, please immediately call 911 or go to the emergency department.  Pager Numbers  - Dr. Bary Likes: [phone]  - Dr. Annette Barters: [phone]  - Dr. Felipe Horton: [phone]   In the event of inclement weather, please call our main line at [phone] for an update on the status of any delays or closures.  Dermatology Medication Tips: Please keep the boxes that topical medications come in in order to help keep track of the instructions about where and how to use these. Pharmacies typically print the medication instructions only on the boxes and not directly on the medication tubes.   If your medication is too expensive, please contact our office at [phone] option 4 or send us  a message through MyChart.   We are unable to tell what your co-pay for medications will be in advance as this is different depending on your insurance coverage. However, we may be able to find a substitute medication at lower cost or fill out paperwork to get insurance to cover a needed medication.   If a prior authorization is required to get your medication covered by your insurance company, please allow us  1-2 business days to complete this process.  Drug prices often vary depending on where the prescription is filled and some pharmacies may offer cheaper prices.  The website www.goodrx.com contains coupons for medications through different pharmacies. The prices here do not account for what the cost may be with help from insurance (it may be cheaper with your insurance), but the website can give you the price if you did not use any insurance.  - You can print the associated coupon and take it with your prescription to the pharmacy.  - You may also stop by our office during regular business hours and pick up a GoodRx coupon card.  - If  you need your prescription sent electronically to a different pharmacy, notify our office through [facility] or by phone at [phone] option 4.     Si Usted Necesita Algo Despus de Su Visita  Tambin puede enviarnos un mensaje a travs de Clinical cytogeneticist. Por lo general respondemos a los mensajes de MyChart en el transcurso de 1 a 2 das hbiles.  Para renovar recetas, por favor pida a su farmacia que se ponga en contacto con nuestra oficina. Franz Jacks de fax es [place] [phone].  Si tiene un asunto urgente cuando la clnica est cerrada y que no puede esperar hasta el siguiente da hbil, puede llamar/localizar a su doctor(a) al nmero que aparece a continuacin.   Por favor, tenga en cuenta que aunque hacemos todo lo posible para estar disponibles para asuntos urgentes fuera del horario de [place], no estamos disponibles las 24 horas del da, los 7 [address]  [address] de la [place].   Si tiene un problema urgente y no puede comunicarse con nosotros, puede optar por buscar atencin mdica  en el consultorio de su doctor(a), en una clnica privada, en un centro de atencin urgente o en una sala de emergencias.  Si tiene Engineer, drilling, por favor llame inmediatamente al 911 o vaya a la sala de emergencias.  Nmeros de bper  - Dr. Bary Likes: [phone]  - Dra. Annette Barters: [phone]  - Dr. Felipe Horton: [phone]   En caso de inclemencias del tiempo, por favor llame a Lajuan Pila principal al [phone] para una actualizacin sobre el [place] de cualquier retraso o cierre.  Consejos para la medicacin en dermatologa: Por favor, guarde las cajas en las que vienen los medicamentos de uso tpico para ayudarle a seguir las instrucciones sobre dnde y cmo usarlos. Las farmacias generalmente imprimen las instrucciones del medicamento slo en las cajas y no directamente en los tubos del [place].   Si su medicamento es muy caro, por favor, pngase en contacto con Bettyjane Brunet llamando  al [phone] y presione la opcin 4 o envenos un mensaje a travs de Clinical cytogeneticist.   No podemos decirle cul ser su copago por los medicamentos por adelantado ya que esto es diferente dependiendo de la cobertura de su seguro. Sin embargo, es posible que podamos encontrar un medicamento sustituto a Audiological scientist un formulario para que el seguro cubra el medicamento que se considera necesario.   Si se requiere una autorizacin previa para que su compaa de seguros [place] su medicamento, por favor permtanos de 1 a 2 das hbiles para completar este proceso.  Los precios de los medicamentos varan con frecuencia dependiendo del Environmental consultant de dnde se surte la receta y alguna farmacias pueden ofrecer precios ms baratos.  El sitio web www.goodrx.com tiene cupones para medicamentos de Health and safety inspector. Los precios aqu no tienen en cuenta lo que podra costar con la ayuda del seguro (puede ser ms barato con su seguro), pero el sitio web puede darle el precio si no utiliz Tourist information centre manager.  -  Puede imprimir el cupn correspondiente y llevarlo con su receta a la farmacia.  - Tambin puede pasar por nuestra oficina durante el horario de atencin regular y Education officer, museum una tarjeta de cupones de GoodRx.  - Si necesita que su receta se enve electrnicamente a una farmacia diferente, informe a nuestra oficina a travs de MyChart de [place] o por telfono llamando al [phone] y presione la opcin 4.

## 2023-12-07 NOTE — Progress Notes (Signed)
   Follow-Up Visit   Subjective  Thomas [name] is a 38 y.o. male who presents for the following: HS follow up. Patient was taking doxycycline  100 mg BID. Patient started Humira injections in February. Patient feels that he was more controlled on doxycycline . Flared at left axilla and buttocks. Patient has noticed headaches since starting Humira.   The patient has spots, moles and lesions to be evaluated, some may be new or changing and the patient may have concern these could be cancer.   The following portions of the chart were reviewed this encounter and updated as appropriate: medications, allergies, medical history  Review of Systems:  No other skin or systemic complaints except as noted in HPI or Assessment and Plan.  Objective  Well appearing patient in no apparent distress; mood and affect are within normal limits.   A focused examination was performed of the following areas:   Relevant exam findings are noted in the Assessment and Plan.    Assessment & Plan   HIDRADENITIS SUPPURATIVA, failed Humira due to headache side effect, chronic, flaring not at patient goal Exam: deferred/clothed, acne scarring on cheeks  Hidradenitis Suppurativa is a chronic; persistent; non-curable, but treatable condition due to abnormal inflamed sweat glands in the body folds (axilla, inframammary, groin, medial thighs), causing recurrent painful draining cysts and scarring. It can be associated with severe scarring acne and cysts; also abscesses and scarring of scalp. The goal is control and prevention of flares, as it is not curable. Scars are permanent and can be thickened. Treatment may include daily use of topical medication and oral antibiotics.  Oral isotretinoin  may also be helpful.  For some cases, Humira or Cosentyx (biologic injections) may be prescribed to decrease the inflammatory process and prevent flares.  When indicated, inflamed cysts may also be treated surgically.  Treatment  Plan: D/c Humira per patient preference. It caused headaches. added to allergy list Restart doxycycline  100 mg twice daily with food.   Doxycycline  should be taken with food to prevent nausea. Do not lay down for 30 minutes after taking. Be cautious with sun exposure and use good sun protection while on this medication. Pregnant women should not take this medication.    HIDRADENITIS SUPPURATIVA   Related Medications doxycycline  (VIBRAMYCIN ) 100 MG capsule Take 1 capsule (100 mg total) by mouth 2 (two) times daily. Take with food.  Return in about 6 months (around 06/07/2024) for Hidradenitis, with Dr. Almeda Aris, RMA, am acting as scribe for Harris Liming, MD .   Documentation: I have reviewed the above documentation for accuracy and completeness, and I agree with the above.  Harris Liming, MD

## 2024-01-07 ENCOUNTER — Other Ambulatory Visit: Payer: Self-pay | Admitting: Family Medicine

## 2024-01-19 ENCOUNTER — Other Ambulatory Visit: Payer: Self-pay | Admitting: *Deleted

## 2024-01-19 MED ORDER — CLINDAMYCIN PHOSPHATE 1 % EX SWAB: CUTANEOUS | 1 refills | Status: DC

## 2024-01-21 ENCOUNTER — Ambulatory Visit (INDEPENDENT_AMBULATORY_CARE_PROVIDER_SITE_OTHER): Payer: MEDICAID | Admitting: Dermatology

## 2024-01-21 ENCOUNTER — Encounter: Payer: Self-pay | Admitting: Dermatology

## 2024-01-21 DIAGNOSIS — Z79899 Other long term (current) drug therapy: Secondary | ICD-10-CM

## 2024-01-21 DIAGNOSIS — L732 Hidradenitis suppurativa: Secondary | ICD-10-CM | POA: Diagnosis not present

## 2024-01-21 DIAGNOSIS — Z7189 Other specified counseling: Secondary | ICD-10-CM

## 2024-01-21 MED ORDER — HUMIRA (2 PEN) 80 MG/0.8ML ~~LOC~~ AJKT: AUTO-INJECTOR | SUBCUTANEOUS | 5 refills | Status: AC

## 2024-01-21 MED ORDER — HUMIRA-CD/UC/HS STARTER 80 MG/0.8ML ~~LOC~~ AJKT: 160.0000 mg | AUTO-INJECTOR | SUBCUTANEOUS | 0 refills | Status: AC

## 2024-01-21 NOTE — Patient Instructions (Signed)

## 2024-01-21 NOTE — Progress Notes (Signed)
 Follow-Up Visit   Subjective  Thomas [name] is a 38 y.o. male who presents for the following: Hidradenitis L axilla and buttocks hx of draining and pain, pt went to PCP 01/19/24 and had a Kenalog injection to L axilla, currently on Doxycycline  100mg  1 po bid and care giver said his HS has worsened since being off Humira   Patient accompanied by care giver who contributes to history.   The following portions of the chart were reviewed this encounter and updated as appropriate: medications, allergies, medical history  Review of Systems:  No other skin or systemic complaints except as noted in HPI or Assessment and Plan.  Objective  Well appearing patient in no apparent distress; mood and affect are within normal limits.   A focused examination was performed of the following areas: Left axilla, buttocks  Relevant exam findings are noted in the Assessment and Plan.    Assessment & Plan   HIDRADENITIS SUPPURATIVA L axilla, buttocks Exam: cribriform sinus tracts with copious purulent drainage on L axilla  Chronic and persistent condition with duration or expected duration over one year. Condition is bothersome/symptomatic for patient. Currently flared.  06/11/23 quant gold negative  Hidradenitis Suppurativa is a chronic; persistent; non-curable, but treatable condition due to abnormal inflamed sweat glands in the body folds (axilla, inframammary, groin, medial thighs), causing recurrent painful draining cysts and scarring. It can be associated with severe scarring acne and cysts; also abscesses and scarring of scalp. The goal is control and prevention of flares, as it is not curable. Scars are permanent and can be thickened. Treatment may include daily use of topical medication and oral antibiotics.  Oral isotretinoin  may also be helpful.  For some cases, Humira  or Cosentyx (biologic injections) may be prescribed to decrease the inflammatory process and prevent flares.  When indicated,  inflamed cysts may also be treated surgically.  Treatment Plan: Discussed restarting Humira , best type of medication to control HS, better than isotretinoin  that was tried and failed, plan to restart Humira , Loading dose Humira  160mg  sq injections on day 1 then Humira  80mg  sq injection on day 15 and then Humira  80mg  sq injections qo wk  Cont Doxycycline  100mg  1 po bid with food and drink Caregiver stated patient did not have headache with Humira  and just did not like the injection. Took it off allergy list  Reviewed risks of biologics including immunosuppression, infections, injection site reaction, and failure to improve condition. Goal is control of skin condition, not cure.  Some older biologics such as Humira  and Enbrel may slightly increase risk of malignancy and may worsen congestive heart failure.  Taltz and Cosentyx may cause inflammatory bowel disease to flare. The use of biologics requires long term medication management, including periodic office visits and monitoring of blood work.  Doxycycline  should be taken with food to prevent nausea. Do not lay down for 30 minutes after taking. Be cautious with sun exposure and use good sun protection while on this medication. Pregnant women should not take this medication.    HIDRADENITIS SUPPURATIVA   Related Medications doxycycline  (VIBRAMYCIN ) 100 MG capsule Take 1 capsule (100 mg total) by mouth 2 (two) times daily. Take with food. adalimumab  (HUMIRA -CD/UC/HS STARTER) 80 MG/0.8ML pen Inject 1.6 mLs (160 mg total) into the skin as directed. Inject 160 mg subcutaneous on day 1, then 80 mg subcutaneous on day 15. adalimumab  (HUMIRA , 2 PEN,) 80 MG/0.8ML pen After completing loading dose inject one pen SQ QOW. LONG-TERM USE OF HIGH-RISK MEDICATION   COUNSELING  AND COORDINATION OF CARE   MEDICATION MANAGEMENT    Return in about 3 months (around 04/22/2024) for HS f/u with Dr. Hester.  I, Grayce Saunas, RMA, am acting as scribe for  Boneta Sharps, MD .   Documentation: I have reviewed the above documentation for accuracy and completeness, and I agree with the above.  Boneta Sharps, MD

## 2024-02-11 ENCOUNTER — Other Ambulatory Visit: Payer: Self-pay | Admitting: Family Medicine

## 2024-02-11 DIAGNOSIS — I10 Essential (primary) hypertension: Secondary | ICD-10-CM

## 2024-02-11 DIAGNOSIS — E559 Vitamin D deficiency, unspecified: Secondary | ICD-10-CM

## 2024-02-12 NOTE — Telephone Encounter (Signed)
 Chart reviewed. Rx refilled.

## 2024-04-19 ENCOUNTER — Ambulatory Visit: Payer: MEDICAID | Admitting: Dermatology

## 2024-05-09 ENCOUNTER — Other Ambulatory Visit: Payer: Self-pay | Admitting: Dermatology

## 2024-05-09 ENCOUNTER — Other Ambulatory Visit: Payer: Self-pay | Admitting: Family Medicine

## 2024-05-09 DIAGNOSIS — L732 Hidradenitis suppurativa: Secondary | ICD-10-CM

## 2024-05-12 NOTE — Telephone Encounter (Signed)
 Chart reviewed. Rx refilled.

## 2024-06-07 ENCOUNTER — Ambulatory Visit: Payer: MEDICAID | Admitting: Dermatology

## 2024-06-13 ENCOUNTER — Other Ambulatory Visit: Payer: Self-pay | Admitting: Dermatology

## 2024-06-13 DIAGNOSIS — L732 Hidradenitis suppurativa: Secondary | ICD-10-CM

## 2024-07-11 ENCOUNTER — Other Ambulatory Visit: Payer: Self-pay | Admitting: Dermatology

## 2024-07-11 DIAGNOSIS — L732 Hidradenitis suppurativa: Secondary | ICD-10-CM

## 2024-08-03 ENCOUNTER — Ambulatory Visit: Payer: MEDICAID | Admitting: Dermatology

## 2024-08-25 ENCOUNTER — Ambulatory Visit: Payer: Self-pay | Admitting: Family Medicine
